# Patient Record
Sex: Male | Born: 1990 | Hispanic: No | Marital: Single | Smoking: Current every day smoker
Health system: Southern US, Community
[De-identification: ages and names within clinical notes are randomized; demographics above are authoritative.]

## PROBLEM LIST (undated history)

## (undated) DIAGNOSIS — F32A Depression, unspecified: Secondary | ICD-10-CM

## (undated) DIAGNOSIS — F329 Major depressive disorder, single episode, unspecified: Secondary | ICD-10-CM

## (undated) HISTORY — PX: NO PAST SURGERIES: SHX2092

---

## 2015-02-04 ENCOUNTER — Inpatient Hospital Stay (HOSPITAL_COMMUNITY): Payer: Self-pay

## 2015-02-04 ENCOUNTER — Inpatient Hospital Stay (HOSPITAL_COMMUNITY)
Admission: EM | Admit: 2015-02-04 | Discharge: 2015-02-05 | DRG: 918 | Disposition: A | Discharge status: Other Acute Inpatient Hospital | Payer: Self-pay | Attending: Internal Medicine | Admitting: Internal Medicine

## 2015-02-04 ENCOUNTER — Encounter (HOSPITAL_COMMUNITY): Payer: Self-pay | Admitting: Emergency Medicine

## 2015-02-04 DIAGNOSIS — F4321 Adjustment disorder with depressed mood: Secondary | ICD-10-CM | POA: Diagnosis present

## 2015-02-04 DIAGNOSIS — F17210 Nicotine dependence, cigarettes, uncomplicated: Secondary | ICD-10-CM | POA: Diagnosis present

## 2015-02-04 DIAGNOSIS — T43203S Poisoning by unspecified antidepressants, assault, sequela: Secondary | ICD-10-CM

## 2015-02-04 DIAGNOSIS — F17200 Nicotine dependence, unspecified, uncomplicated: Secondary | ICD-10-CM | POA: Diagnosis present

## 2015-02-04 DIAGNOSIS — D72829 Elevated white blood cell count, unspecified: Secondary | ICD-10-CM | POA: Diagnosis present

## 2015-02-04 DIAGNOSIS — R112 Nausea with vomiting, unspecified: Secondary | ICD-10-CM | POA: Diagnosis present

## 2015-02-04 DIAGNOSIS — Z720 Tobacco use: Secondary | ICD-10-CM

## 2015-02-04 DIAGNOSIS — T43592A Poisoning by other antipsychotics and neuroleptics, intentional self-harm, initial encounter: Principal | ICD-10-CM | POA: Diagnosis present

## 2015-02-04 DIAGNOSIS — F909 Attention-deficit hyperactivity disorder, unspecified type: Secondary | ICD-10-CM | POA: Diagnosis present

## 2015-02-04 DIAGNOSIS — T1491 Suicide attempt: Secondary | ICD-10-CM

## 2015-02-04 DIAGNOSIS — R45851 Suicidal ideations: Secondary | ICD-10-CM

## 2015-02-04 DIAGNOSIS — T50902D Poisoning by unspecified drugs, medicaments and biological substances, intentional self-harm, subsequent encounter: Secondary | ICD-10-CM

## 2015-02-04 DIAGNOSIS — G4700 Insomnia, unspecified: Secondary | ICD-10-CM | POA: Diagnosis present

## 2015-02-04 DIAGNOSIS — R109 Unspecified abdominal pain: Secondary | ICD-10-CM | POA: Diagnosis present

## 2015-02-04 DIAGNOSIS — R1033 Periumbilical pain: Secondary | ICD-10-CM

## 2015-02-04 DIAGNOSIS — T43202A Poisoning by unspecified antidepressants, intentional self-harm, initial encounter: Secondary | ICD-10-CM

## 2015-02-04 DIAGNOSIS — R1110 Vomiting, unspecified: Secondary | ICD-10-CM

## 2015-02-04 DIAGNOSIS — T43222A Poisoning by selective serotonin reuptake inhibitors, intentional self-harm, initial encounter: Secondary | ICD-10-CM | POA: Diagnosis present

## 2015-02-04 DIAGNOSIS — T50902A Poisoning by unspecified drugs, medicaments and biological substances, intentional self-harm, initial encounter: Secondary | ICD-10-CM

## 2015-02-04 DIAGNOSIS — F322 Major depressive disorder, single episode, severe without psychotic features: Secondary | ICD-10-CM

## 2015-02-04 DIAGNOSIS — Z818 Family history of other mental and behavioral disorders: Secondary | ICD-10-CM

## 2015-02-04 HISTORY — DX: Major depressive disorder, single episode, unspecified: F32.9

## 2015-02-04 HISTORY — DX: Depression, unspecified: F32.A

## 2015-02-04 LAB — COMPREHENSIVE METABOLIC PANEL
ALT: 13 U/L — ABNORMAL LOW (ref 17–63)
AST: 22 U/L (ref 15–41)
Albumin: 4.2 g/dL (ref 3.5–5.0)
Alkaline Phosphatase: 57 U/L (ref 38–126)
Anion gap: 7 (ref 5–15)
BILIRUBIN TOTAL: 0.4 mg/dL (ref 0.3–1.2)
BUN: 11 mg/dL (ref 6–20)
CO2: 24 mmol/L (ref 22–32)
CREATININE: 0.87 mg/dL (ref 0.61–1.24)
Calcium: 9.2 mg/dL (ref 8.9–10.3)
Chloride: 107 mmol/L (ref 101–111)
GFR calc Af Amer: >60 mL/min (ref >60)
Glucose, Bld: 117 mg/dL — ABNORMAL HIGH (ref 65–99)
POTASSIUM: 4.2 mmol/L (ref 3.5–5.1)
Sodium: 138 mmol/L (ref 135–145)
TOTAL PROTEIN: 6.7 g/dL (ref 6.5–8.1)

## 2015-02-04 LAB — RAPID URINE DRUG SCREEN, HOSP PERFORMED
Amphetamines: NOT DETECTED
BARBITURATES: NOT DETECTED
Benzodiazepines: NOT DETECTED
COCAINE: NOT DETECTED
OPIATES: NOT DETECTED
Tetrahydrocannabinol: NOT DETECTED

## 2015-02-04 LAB — CBC
HCT: 39.6 % (ref 39.0–52.0)
Hemoglobin: 13.8 g/dL (ref 13.0–17.0)
MCH: 29.7 pg (ref 26.0–34.0)
MCHC: 34.8 g/dL (ref 30.0–36.0)
MCV: 85.3 fL (ref 78.0–100.0)
PLATELETS: 319 10*3/uL (ref 150–400)
RBC: 4.64 MIL/uL (ref 4.22–5.81)
RDW: 12.8 % (ref 11.5–15.5)
WBC: 11.6 10*3/uL — AB (ref 4.0–10.5)

## 2015-02-04 LAB — DIFFERENTIAL
BASOS ABS: 0.1 10*3/uL (ref 0.0–0.1)
Basophils Relative: 1 %
EOS ABS: 0.3 10*3/uL (ref 0.0–0.7)
Eosinophils Relative: 2 %
LYMPHS ABS: 2.8 10*3/uL (ref 0.7–4.0)
Lymphocytes Relative: 24 %
Monocytes Absolute: 0.7 10*3/uL (ref 0.1–1.0)
Monocytes Relative: 6 %
NEUTROS ABS: 7.8 10*3/uL — AB (ref 1.7–7.7)
NEUTROS PCT: 67 %

## 2015-02-04 LAB — ETHANOL

## 2015-02-04 LAB — CBG MONITORING, ED: Glucose-Capillary: 121 mg/dL — ABNORMAL HIGH (ref 65–99)

## 2015-02-04 LAB — ACETAMINOPHEN LEVEL: Acetaminophen (Tylenol), Serum: <10 ug/mL — ABNORMAL LOW (ref 10–30)

## 2015-02-04 LAB — SALICYLATE LEVEL: Salicylate Lvl: <4 mg/dL (ref 2.8–30.0)

## 2015-02-04 LAB — MAGNESIUM: Magnesium: 1.8 mg/dL (ref 1.7–2.4)

## 2015-02-04 MED ORDER — PROMETHAZINE HCL 25 MG/ML IJ SOLN
12.5000 mg | Freq: Once | INTRAMUSCULAR | Status: AC
  Administered 2015-02-04: 12.5 mg via INTRAVENOUS
  Filled 2015-02-04: qty 1

## 2015-02-04 MED ORDER — ENOXAPARIN SODIUM 40 MG/0.4ML ~~LOC~~ SOLN
40.0000 mg | Freq: Every day | SUBCUTANEOUS | Status: DC
  Administered 2015-02-04 – 2015-02-05 (×2): 40 mg via SUBCUTANEOUS
  Filled 2015-02-04 (×3): qty 0.4

## 2015-02-04 MED ORDER — SODIUM CHLORIDE 0.9 % IJ SOLN
3.0000 mL | Freq: Two times a day (BID) | INTRAMUSCULAR | Status: DC
  Administered 2015-02-04 (×2): 3 mL via INTRAVENOUS

## 2015-02-04 MED ORDER — METOCLOPRAMIDE HCL 5 MG/ML IJ SOLN
5.0000 mg | Freq: Three times a day (TID) | INTRAMUSCULAR | Status: DC
  Administered 2015-02-04 – 2015-02-05 (×3): 5 mg via INTRAVENOUS
  Filled 2015-02-04 (×3): qty 2

## 2015-02-04 MED ORDER — SODIUM CHLORIDE 0.9 % IV SOLN
INTRAVENOUS | Status: DC
  Administered 2015-02-04 – 2015-02-05 (×2): via INTRAVENOUS
  Filled 2015-02-04 (×5): qty 1000

## 2015-02-04 MED ORDER — ONDANSETRON HCL 4 MG/2ML IJ SOLN
4.0000 mg | Freq: Four times a day (QID) | INTRAMUSCULAR | Status: DC | PRN
  Administered 2015-02-04: 4 mg via INTRAVENOUS
  Filled 2015-02-04: qty 2

## 2015-02-04 MED ORDER — MAGNESIUM SULFATE 2 GM/50ML IV SOLN
2.0000 g | Freq: Once | INTRAVENOUS | Status: AC
  Administered 2015-02-04: 2 g via INTRAVENOUS
  Filled 2015-02-04: qty 50

## 2015-02-04 MED ORDER — LORAZEPAM 2 MG/ML IJ SOLN: 1.0000 mg | INTRAMUSCULAR | Status: DC | PRN

## 2015-02-04 NOTE — ED Notes (Signed)
Pt escorted to restroom with this RN and Zella Ballobin NT

## 2015-02-04 NOTE — ED Notes (Signed)
Just spoke with poison control. They called to follow up on pt and see how he is doing. Vital signs and treatment thursfar shared with poison control. Pt is stable and VSS.

## 2015-02-04 NOTE — Consult Note (Signed)
[facility] Face-to-Face Psychiatry Consult   Reason for Consult:  Depression, suicide attempt by overdose Referring Physician: Dr. Shanon Brow Tat Patient Identification: Spencer Hale MRN:  [id number] Principal Diagnosis: Intentional drug overdose [facility]) Diagnosis:   Patient Active Problem List   Diagnosis Date Noted  . Intentional drug overdose ([place]) [T50.902A] 02/04/2015    Priority: High  . Major depressive disorder, single episode, severe without psychotic features ([place]) [F32.2]     Priority: High  . Overdose of antidepressant [T43.201A] 02/04/2015  . Tobacco abuse [Z72.0] 02/04/2015  . Abdominal pain [R10.9] 02/04/2015  . Suicide attempt ([place]) [T14.91] 02/04/2015    Total Time spent with patient: 1 hour  Subjective:   Spencer Hale is a 24 y.o. male patient admitted with suicide attempt by overdosing on multiple medications.  HPI: Thanks for asking me to do a psychiatric consultation on Spencer Hale,  a 24 y.o. Man, single, unemployed who reports history of ADHD and Depression. Patient was brought to the Emergency Department after he attempted suicide by overdosing on his girlfriend's medications. Patient reports that he was having a heated argument with his girlfriend last night, things got out of hand and he overdosed on 30 pills of Abilify, Celexa, and Prozac. Patient reports worsening depressive symptoms for the past 2 months after losing his job and has not been able to find another one. He is stressed out due to lack of money to pay for child support and recurrent arguments with his current girlfriend. Patient  reports frequent crying episodes, hopelessness, poor appetite, difficulty sleeping and recurrent suicidal thoughts. He  denies delusional thinking, auditory/visual hallucinations, drugs or alcohol abuse. He needs psychiatric inpatient admission, he is unable to contract for safety.   Past Psychiatric History: ADHD  Risk to Self: Is patient at risk for suicide?: Yes Risk to Others:    Prior Inpatient Therapy:   Prior Outpatient Therapy:    Past Medical History:  Past Medical History  Diagnosis Date  . Depression     Past Surgical History  Procedure Laterality Date  . No past surgeries     Family History: History reviewed. No pertinent family history. Family Psychiatric  History: Mother and maternal grandmother suffers from depression Social History:  History  Alcohol Use  . Yes    Comment: [employer] hard lemonade on weekends     History  Drug Use No    Social History   Social History  . Marital Status: Single    Spouse Name: N/A  . Number of Children: N/A  . Years of Education: N/A   Social History Main Topics  . Smoking status: Current Every Day Smoker    Types: Cigarettes  . Smokeless tobacco: None  . Alcohol Use: Yes     Comment: [employer] hard lemonade on weekends  . Drug Use: No  . Sexual Activity: Not Asked   Other Topics Concern  . None   Social History Narrative  . None   Additional Social History:                          Allergies:  No Known Allergies  Labs:  Results for orders placed or performed during the hospital encounter of 02/04/15 (from the past 48 hour(s))  Comprehensive metabolic panel     Status: Abnormal   Collection Time: 02/04/15  2:40 AM  Result Value Ref Range   Sodium 138 135 - 145 mmol/L   Potassium 4.2 3.5 - 5.1 mmol/L   Chloride  107 101 - 111 mmol/L   CO2 24 22 - 32 mmol/L   Glucose, Bld 117 (H) 65 - 99 mg/dL   BUN 11 6 - 20 mg/dL   Creatinine, Ser 0.87 0.61 - 1.24 mg/dL   Calcium 9.2 8.9 - 10.3 mg/dL   Total Protein 6.7 6.5 - 8.1 g/dL   Albumin 4.2 3.5 - 5.0 g/dL   AST 22 15 - 41 U/L   ALT 13 (L) 17 - 63 U/L   Alkaline Phosphatase 57 38 - 126 U/L   Total Bilirubin 0.4 0.3 - 1.2 mg/dL   GFR calc non Af Amer >60 >60 mL/min   GFR calc Af Amer >60 >60 mL/min    Comment: (NOTE) The eGFR has been calculated using the CKD EPI equation. This calculation has not been validated in all clinical  situations. eGFR's persistently <60 mL/min signify possible Chronic Kidney Disease.    Anion gap 7 5 - 15  Ethanol (ETOH)     Status: None   Collection Time: 02/04/15  2:40 AM  Result Value Ref Range   Alcohol, Ethyl (B) <5 <5 mg/dL    Comment:        LOWEST DETECTABLE LIMIT FOR SERUM ALCOHOL IS 5 mg/dL FOR MEDICAL PURPOSES ONLY   Salicylate level     Status: None   Collection Time: 02/04/15  2:40 AM  Result Value Ref Range   Salicylate Lvl <4.1 2.8 - 30.0 mg/dL  Acetaminophen level     Status: Abnormal   Collection Time: 02/04/15  2:40 AM  Result Value Ref Range   Acetaminophen (Tylenol), Serum <10 (L) 10 - 30 ug/mL    Comment:        THERAPEUTIC CONCENTRATIONS VARY SIGNIFICANTLY. A RANGE OF 10-30 ug/mL MAY BE AN EFFECTIVE CONCENTRATION FOR MANY PATIENTS. HOWEVER, SOME ARE BEST TREATED AT CONCENTRATIONS OUTSIDE THIS RANGE. ACETAMINOPHEN CONCENTRATIONS >150 ug/mL AT 4 HOURS AFTER INGESTION AND >50 ug/mL AT 12 HOURS AFTER INGESTION ARE OFTEN ASSOCIATED WITH TOXIC REACTIONS.   CBC     Status: Abnormal   Collection Time: 02/04/15  2:40 AM  Result Value Ref Range   WBC 11.6 (H) 4.0 - 10.5 K/uL   RBC 4.64 4.22 - 5.81 MIL/uL   Hemoglobin 13.8 13.0 - 17.0 g/dL   HCT 39.6 39.0 - 52.0 %   MCV 85.3 78.0 - 100.0 fL   MCH 29.7 26.0 - 34.0 pg   MCHC 34.8 30.0 - 36.0 g/dL   RDW 12.8 11.5 - 15.5 %   Platelets 319 150 - 400 K/uL  Magnesium     Status: None   Collection Time: 02/04/15  2:40 AM  Result Value Ref Range   Magnesium 1.8 1.7 - 2.4 mg/dL  Differential     Status: Abnormal   Collection Time: 02/04/15  2:40 AM  Result Value Ref Range   Neutrophils Relative % 67 %   Neutro Abs 7.8 (H) 1.7 - 7.7 K/uL   Lymphocytes Relative 24 %   Lymphs Abs 2.8 0.7 - 4.0 K/uL   Monocytes Relative 6 %   Monocytes Absolute 0.7 0.1 - 1.0 K/uL   Eosinophils Relative 2 %   Eosinophils Absolute 0.3 0.0 - 0.7 K/uL   Basophils Relative 1 %   Basophils Absolute 0.1 0.0 - 0.1 K/uL  Urine  rapid drug screen (hosp performed) (Not at [facility])     Status: None   Collection Time: 02/04/15  2:59 AM  Result Value Ref Range   Opiates NONE DETECTED  NONE DETECTED   Cocaine NONE DETECTED NONE DETECTED   Benzodiazepines NONE DETECTED NONE DETECTED   Amphetamines NONE DETECTED NONE DETECTED   Tetrahydrocannabinol NONE DETECTED NONE DETECTED   Barbiturates NONE DETECTED NONE DETECTED    Comment:        DRUG SCREEN FOR MEDICAL PURPOSES ONLY.  IF CONFIRMATION IS NEEDED FOR ANY PURPOSE, NOTIFY LAB WITHIN 5 DAYS.        LOWEST DETECTABLE LIMITS FOR URINE DRUG SCREEN Drug Class       Cutoff (ng/mL) Amphetamine      1000 Barbiturate      200 Benzodiazepine   569 Tricyclics       794 Opiates          300 Cocaine          300 THC              50   CBG monitoring, ED     Status: Abnormal   Collection Time: 02/04/15  3:05 AM  Result Value Ref Range   Glucose-Capillary 121 (H) 65 - 99 mg/dL    Current Facility-Administered Medications  Medication Dose Route Frequency Provider Last Rate Last Dose  . enoxaparin (LOVENOX) injection 40 mg  40 mg Subcutaneous Daily Norval Morton, MD   40 mg at 02/04/15 1010  . LORazepam (ATIVAN) injection 1 mg  1 mg Intravenous Q4H PRN Norval Morton, MD      . ondansetron (ZOFRAN) injection 4 mg  4 mg Intravenous Q6H PRN Orson Eva, MD   4 mg at 02/04/15 1336  . sodium chloride 0.9 % 1,000 mL with potassium chloride 20 mEq infusion   Intravenous Continuous David Tat, MD      . sodium chloride 0.9 % injection 3 mL  3 mL Intravenous Q12H Rondell Charmayne Sheer, MD   3 mL at 02/04/15 1011    Musculoskeletal: Strength & Muscle Tone: within normal limits Gait & Station: normal Patient leans: N/A  Psychiatric Specialty Exam: Review of Systems  Constitutional: Positive for malaise/fatigue and diaphoresis.  HENT: Negative.   Eyes: Negative.   Respiratory: Negative.   Cardiovascular: Negative.   Gastrointestinal: Positive for nausea, vomiting and abdominal  pain.  Genitourinary: Negative.   Musculoskeletal: Negative.   Skin: Negative.   Neurological: Positive for weakness.  Endo/Heme/Allergies: Negative.   Psychiatric/Behavioral: Positive for depression and suicidal ideas. The patient is nervous/anxious.     Blood pressure 147/84, pulse 88, temperature 98.2 F (36.8 C), temperature source Oral, resp. rate 16, height 5' 4.8" (1.646 m), weight 56.7 kg (125 lb), SpO2 100 %.Body mass index is 20.93 kg/(m^2).  General Appearance: Casual  Eye Contact::  Minimal  Speech:  Clear and Coherent  Volume:  Decreased  Mood:  Depressed, Dysphoric and Hopeless  Affect:  Constricted  Thought Process:  Goal Directed  Orientation:  Full (Time, Place, and Person)  Thought Content:  Negative  Suicidal Thoughts:  Yes.  without intent/plan  Homicidal Thoughts:  No  Memory:  Immediate;   Good Recent;   Good Remote;   Good  Judgement:  Impaired  Insight:  Lacking  Psychomotor Activity:  Decreased  Concentration:  Fair  Recall:  Good  Fund of Knowledge:Good  Language: Good  Akathisia:  No  Handed:  Right  AIMS (if indicated):     Assets:  Communication Skills Desire for Improvement Physical Health Social Support  ADL's:  Intact  Cognition: WNL  Sleep:   poor   Treatment Plan Summary: Daily contact  with patient to assess and evaluate symptoms and progress in treatment: Medication management; Please consider putting patient on antidepressant when medically cleared.  Disposition:  -Recommend psychiatric Inpatient admission when medically cleared. -Supportive therapy provided about ongoing stressors. -Unit social worker to assist with placing patient in inpatient psychiatric facility when he is medically stable.  Corena Pilgrim, MD 02/04/2015 3:09 PM

## 2015-02-04 NOTE — ED Notes (Signed)
Pt escorted back to bed with this RN and Veterinary surgeonobin RN. Pt had formed bowel movement.

## 2015-02-04 NOTE — ED Notes (Addendum)
Notified [facility] of patient's ingestion, she reports that prozac will cause sinus tachycardia, HTN and QTC elongation. Abilify will cause CNS depression and tachycardia. Celexa will cause ventricular dysrhythmias for up to 24 hours, bradycardia,QTC elongation and seizures. Almira CoasterGina recommends 24 hour cardiac monitoring and repeat EKG at that time. Recommends drawing k&mag levels, and replace to optimal level, benzos as needed for symptom control.

## 2015-02-04 NOTE — ED Notes (Signed)
Notified Dr. Preston FleetingGlick of patient's presence in department and Poison Control's recommendation.

## 2015-02-04 NOTE — H&P (Signed)
[facility] History and Physical  Spencer Hale JYN:[id number] DOB: Aug 07, 1990 DOA: 02/04/2015  Referring physician: ED PCP: No PCP Per Patient   Chief Complaint: Overdose HPI:  Patient is a 24 year old male with past medical history significant for tobacco abuse; who presents after an intentional overdose with a combination of pills including Abilify, Prozac, and Celexa around 10:30 PM last night. Patient states that he wanted to be happy as the reason why he took the pills. He notes that the pills were not prescribed to him. Does not answer how he obtained them Additional history is obtained from ED physician, who states that the patient just recently lost his job 2 months ago and has been feeling depressed.   Currently denies suicidal ideation, fever, chills. Patient does note associated symptoms of some abdominal pain located periumbilically. He does not necessarily offer any other details regarding when symptoms started   Once in the emergency department poison control was notified and stated that the patient needed 24-hour monitoring for possible prolonged QT, agitation, and electrolyte abnormalities.  Review of Systems  Constitutional: Negative for fever and chills.  HENT: Negative for hearing loss.   Eyes: Negative for double vision and discharge.  Respiratory: Negative for cough and hemoptysis.   Cardiovascular: Negative for chest pain and palpitations.  Gastrointestinal: Positive for abdominal pain. Negative for nausea and vomiting.  Genitourinary: Negative for dysuria and urgency.  Musculoskeletal: Negative for back pain and neck pain.  Skin: Negative for itching and rash.  Neurological: Negative for sensory change, speech change and headaches.  Endo/Heme/Allergies: Negative for environmental allergies. Does not bruise/bleed easily.  Psychiatric/Behavioral: Positive for depression and suicidal ideas. The patient has insomnia. The patient is not nervous/anxious.          Past Medical History  Diagnosis Date  . Depression      Past Surgical History  Procedure Laterality Date  . No past surgeries        Social History:  reports that he has been smoking Cigarettes.  He does not have any smokeless tobacco history on file. He reports that he drinks alcohol. He reports that he does not use illicit drugs.   Where does patient live--home   Can patient participate in ADLs?YES  No Known Allergies  History reviewed. No pertinent family history.    FAMILY HISTORY  When questioned  Directly-patient reports  No family history of HTN, CVA ,DIABETES, TB, Cancer CAD, Bleeding Disorders, Sickle Cell, diabetes, anemia, asthma,   Prior to Admission medications   Not on File     Physical Exam: Filed Vitals:   02/04/15 0400 02/04/15 0415 02/04/15 0430 02/04/15 0445  BP: 122/74 130/72 132/70 127/72  Pulse: 92 85 75 74  Temp:      TempSrc:      Resp: SpO2: 99% 99% 98% 97%     Constitutional: Vital signs reviewed. Patient is a well-developed and well-nourished in no acute distress and cooperative with exam. Lethargic, but arousable patient drifts off to sleep several times during exam. Head: Normocephalic and atraumatic  Ear: TM normal bilaterally  Mouth: no erythema or exudates, MMM  Eyes: PERRL, EOMI, conjunctivae normal, No scleral icterus.  Neck: Supple, Trachea midline normal ROM, No JVD, mass, thyromegaly, or carotid bruit present.  Cardiovascular: RRR, S1 normal, S2 normal, no MRG, pulses symmetric and intact bilaterally  Pulmonary/Chest: CTAB, no wheezes, rales, or rhonchi  Abdominal: Soft. Tenderness to palpation around umbilical region no hernia noted, non-distended, bowel sounds are  normal, no masses, organomegaly, or guarding present.  GU: no CVA tenderness Musculoskeletal: No joint deformities, erythema, or stiffness, ROM full and no nontender Ext: no edema and no cyanosis, pulses palpable bilaterally (DP and PT)   Hematology: no cervical, inginal, or axillary adenopathy.  Neurological: A&O x3, Strenght is normal and symmetric bilaterally, cranial nerve II-XII are grossly intact, no focal motor deficit, sensory intact to light touch bilaterally.  Skin: Warm, dry and intact. No rash, cyanosis, or clubbing.  Psychiatric: flat affect. Speech is normal. Judgment is poor. Cognition and memory are normal.      Data Review   Micro Results No results found for this or any previous visit (from the past 240 hour(s)).  Radiology Reports No results found.   CBC  Recent Labs Lab 02/04/15 0240  WBC 11.6*  HGB 13.8  HCT 39.6  PLT 319  MCV 85.3  MCH 29.7  MCHC 34.8  RDW 12.8  LYMPHSABS 2.8  MONOABS 0.7  EOSABS 0.3  BASOSABS 0.1    Chemistries   Recent Labs Lab 02/04/15 0240  NA 138  K 4.2  CL 107  CO2 24  GLUCOSE 117*  BUN 11  CREATININE 0.87  CALCIUM 9.2  MG 1.8  AST 22  ALT 13*  ALKPHOS 57  BILITOT 0.4   ------------------------------------------------------------------------------------------------------------------ CrCl cannot be calculated (Unknown ideal weight.). ------------------------------------------------------------------------------------------------------------------ No results for input(s): HGBA1C in the last 72 hours. ------------------------------------------------------------------------------------------------------------------ No results for input(s): CHOL, HDL, LDLCALC, TRIG, CHOLHDL, LDLDIRECT in the last 72 hours. ------------------------------------------------------------------------------------------------------------------ No results for input(s): TSH, T4TOTAL, T3FREE, THYROIDAB in the last 72 hours.  Invalid input(s): FREET3 ------------------------------------------------------------------------------------------------------------------ No results for input(s): VITAMINB12, FOLATE, FERRITIN, TIBC, IRON, RETICCTPCT in the last 72  hours.  Coagulation profile No results for input(s): INR, PROTIME in the last 168 hours.  No results for input(s): DDIMER in the last 72 hours.  Cardiac Enzymes No results for input(s): CKMB, TROPONINI, MYOGLOBIN in the last 168 hours.  Invalid input(s): CK ------------------------------------------------------------------------------------------------------------------ Invalid input(s): POCBNP   CBG:  Recent Labs Lab 02/04/15 0305  GLUCAP 121*       EKG: Independently reviewed. Normal sinus rhythm is a normal EKG   Assessment/Plan Principal Problem:  Intentional drug overdose with antidepressants: Patient with a overdose of approximately 20-30 pills including Abilify, Prozac, and Celexa at approximately 10:30 PM. Patient was attempting suicide. Poison control contacted recommend cardiac monitoring for 24 hours. -Admit to a telemetry bed -sitter to bedside -EKGs every 6 hours x3 -Ativan prn agitation -Monitoring for electrolyte abnormalities with BMP  Suicide attempt: Patient currently denies any suicidal ideations. -Once medically stable will need Psych consult    Tobacco abuse: Patient smokes one fourth pack a day of cigarettes. -Offered nicotine patch  Abdominal pain: Unclear  Cause for symptoms at this time. PE no overt sign of abdominal hernia +bowel signs. -continue to monitor  Code Status:   full Family Communication: bedside Disposition Plan: admit   Total time spent 55 minutes.Greater than 50% of this time was spent in counseling, explanation of diagnosis, planning of further management, and coordination of care  Clydie BraunRondell A Smith [facility] Pager [phone]  If 7PM-7AM, please contact night-coverage [email or web address] Password [email or web address] 02/04/2015, 5:00 AM

## 2015-02-04 NOTE — Progress Notes (Signed)
Received order from Dr. Arbutus Leasat requesting psychiatric consult for patient.   TTS counselor to [facility] for completion.   Janann ColonelGregory Pickett Jr. MSW, LCSW Therapeutic Triage Services-Triage Specialist   Phone: [phone]

## 2015-02-04 NOTE — ED Notes (Addendum)
Pt arrives via EMS for od of celexa, prozac, and abilify - 30 pills in total. One episode of emesis PTA with some pills present in vomit. States he ODed with GF, who [facility]. Pt bradying down, then sinus tach. Pt reports that he's had plans to attempt suicide for 3-4 days, states he's sad. Would not elaborate further for this RN. Pt states he made a mistake, and that he feels stupid. Pt is alertx4, remorseful, skin warm and dry. Ambulates with a steady gait. Pt has been made aware that because he attempted suicide tonight, he looses a lot of privileges in order to maintain safety, including access to his cell phone.

## 2015-02-04 NOTE — Progress Notes (Signed)
PROGRESS NOTE  Spencer Hale ZOX:[id number] DOB: [birth year] DOA: 02/04/2015 PCP: No PCP Per Patient  Brief history 24 year old male with a history of depression and no other chronic medical problems presented with intentional drug overdose as part of the suicide attempt. The patient feels depressed because he has lost his job approximately 2 months ago and has not been able to find new work. He states that he took his girlfriends pills, approximately 30 pills total around 10:30 PM on 02/03/2015. Patient was not forthcoming regarding exactly what pills he took, but from the medical record, it appears that he took Abilify, fluoxetine, and Celexa. He denies any headache, chest pain or shortness breath, diarrhea, fevers, chills. He had an episode of nausea and vomiting since admission but denies any abdominal pain, dysuria, hematuria. He smokes approximately 1 pack every 3 days. He denies any alcohol or other illegal drug use. Assessment/Plan: Intentional Overdose/Suicide attempt -psychiatry has been consulted -one-on-one sitter -monitor for QT prolongation -Urine drug screen is negative -Repeat EKG in the morning -Repeat BMP in am  Tobacco Abuse -Tobacco cessation discussed -NicoDerm patch deferred  Nausea/vomiting -Hepatic enzymes negative -check lipase -IVF  Abdominal pain -resolved -improved after BM  Leukocytosis -Afebrile and hemodynamically stable -Recommend off antibiotics -UA  Family Communication:   Pt at beside Disposition Plan:   Home 11/14 if stable and cleared by psychiatry       Procedures/Studies:  No results found.      Subjective: Patient states that abdominal pain is improved. Her one episode of nausea and vomiting today. Denies any fevers, chills, chest pain, shortness breath, diarrhea, hematochezia, melena. There is no dysuria or hematuria. No headache or visual disturbance.  Objective: Filed Vitals:   02/04/15 0515 02/04/15 0530  02/04/15 0545 02/04/15 0606  BP: 146/79 144/84 144/77 147/84  Pulse: 90 91 99 88  Temp:    98.2 F (36.8 C)  TempSrc:    Oral  Resp: 18 14 19 16   Height:    5' 4.8" (1.646 m)  Weight:    56.7 kg (125 lb)  SpO2: 97% 97% 98% 100%   No intake or output data in the 24 hours ending 02/04/15 1351 Weight change:  Exam:   General:  Pt is alert, follows commands appropriately, not in acute distress  HEENT: No icterus, No thrush, No neck mass, [place]/AT  Cardiovascular: RRR, S1/S2, no rubs, no gallops  Respiratory: CTA bilaterally, no wheezing, no crackles, no rhonchi  Abdomen: Soft/+BS, non tender, non distended, no guarding  Extremities: No edema, No lymphangitis, No petechiae, No rashes, no synovitis  Data Reviewed: Basic Metabolic Panel:  Recent Labs Lab 02/04/15 0240  NA 138  K 4.2  CL 107  CO2 24  GLUCOSE 117*  BUN 11  CREATININE 0.87  CALCIUM 9.2  MG 1.8   Liver Function Tests:  Recent Labs Lab 02/04/15 0240  AST 22  ALT 13*  ALKPHOS 57  BILITOT 0.4  PROT 6.7  ALBUMIN 4.2   No results for input(s): LIPASE, AMYLASE in the last 168 hours. No results for input(s): AMMONIA in the last 168 hours. CBC:  Recent Labs Lab 02/04/15 0240  WBC 11.6*  NEUTROABS 7.8*  HGB 13.8  HCT 39.6  MCV 85.3  PLT 319   Cardiac Enzymes: No results for input(s): CKTOTAL, CKMB, CKMBINDEX, TROPONINI in the last 168 hours. BNP: Invalid input(s): POCBNP CBG:  Recent Labs Lab 02/04/15 0305  GLUCAP 121*  No results found for this or any previous visit (from the past 240 hour(s)).   Scheduled Meds: . enoxaparin (LOVENOX) injection  40 mg Subcutaneous Daily  . sodium chloride  3 mL Intravenous Q12H   Continuous Infusions:    Marletta Bousquet, DO  [facility] Pager [phone]  If 7PM-7AM, please contact night-coverage [email or web address] Password [email or web address] 02/04/2015, 1:51 PM   LOS: 0 days

## 2015-02-04 NOTE — ED Notes (Signed)
5W Charge RN called and stated that they could not take the pt for 30-45 minutes due to not having a sitter.

## 2015-02-04 NOTE — Progress Notes (Signed)
CMDD notified pt in sinus tachycardia. NAD upon pt assessment. Pt reports it feels like his heart is racing. HR weak, fast, regular. No symptoms present. Will continue to monitor.

## 2015-02-04 NOTE — Progress Notes (Signed)
Received report from MuscodaKevin in the ED room 22.

## 2015-02-04 NOTE — ED Provider Notes (Signed)
CSN: [id number]     Arrival date & time 02/04/15  0202 History  By signing my name below, I, [facility], attest that this documentation has been prepared under the direction and in the presence of Dione Booze, MD. Electronically Signed: Randell Patient, ED Scribe. 02/04/2015. 2:33 AM.   Chief Complaint  Patient presents with  . Drug Overdose  . Suicide Attempt   The history is provided by the patient. No language interpreter was used.   HPI Comments: Spencer Hale is a 24 y.o. male who presents to the Emergency Department after a suicide attempt by drug OD about 4 hours ago. He reports taking 30 pills of Abilify, Celexa, and Prozac with his girlfriend. Patient reports feeling depressed for the past 2 months after losing his job and reports associated increased crying, insomnia, and no desire to do daily activities. Patient denies auditory or visual hallucinations, fever, or current suicidal thoughts. He is a 0.3 ppd smoker and denies illicit drug use. He denies SI or depression prior to losing his job a few months ago.    History reviewed. No pertinent past medical history. History reviewed. No pertinent past surgical history. No family history on file. Social History  Substance Use Topics  . Smoking status: Current Every Day Smoker    Types: Cigarettes  . Smokeless tobacco: None  . Alcohol Use: No    Review of Systems  Psychiatric/Behavioral: Positive for sleep disturbance and dysphoric mood. Negative for suicidal ideas and hallucinations.  All other systems reviewed and are negative.    Allergies  Review of patient's allergies indicates no known allergies.  Home Medications   Prior to Admission medications   Not on File   BP 138/88 mmHg  Pulse 84  Temp(Src) 98 F (36.7 C) (Oral)  Resp 20  SpO2 99% Physical Exam  Constitutional: He is oriented to person, place, and time. He appears well-developed and well-nourished. No distress.  HENT:  Head:  Normocephalic and atraumatic.  Right Ear: Hearing normal.  Left Ear: Hearing normal.  Nose: Nose normal.  Mouth/Throat: Oropharynx is clear and moist and mucous membranes are normal.  Eyes: Conjunctivae and EOM are normal. Pupils are equal, round, and reactive to light.  Neck: Normal range of motion. Neck supple. No JVD present.  Cardiovascular: Normal rate, regular rhythm, S1 normal, S2 normal and normal heart sounds.  Exam reveals no gallop and no friction rub.   No murmur heard. Pulmonary/Chest: Effort normal and breath sounds normal. He has no wheezes. He has no rales. He exhibits no tenderness.  Abdominal: Soft. Normal appearance and bowel sounds are normal. He exhibits no distension and no mass. There is no hepatosplenomegaly. There is no tenderness. There is no tenderness at McBurney's point and negative Murphy's sign. No hernia.  Musculoskeletal: Normal range of motion.  Lymphadenopathy:    He has no cervical adenopathy.  Neurological: He is alert and oriented to person, place, and time. He has normal strength. No cranial nerve deficit or sensory deficit. He exhibits normal muscle tone. Coordination normal. GCS eye subscore is 4. GCS verbal subscore is 5. GCS motor subscore is 6.  Skin: Skin is warm, dry and intact. No rash noted. No cyanosis.  Psychiatric: His speech is normal and behavior is normal. Thought content normal. He exhibits a depressed mood.  Depressed affect  Nursing note and vitals reviewed.   ED Course  Procedures   DIAGNOSTIC STUDIES: Oxygen Saturation is 99% on RA, normal by my interpretation.    COORDINATION  OF CARE: 2:06 AM Discussed treatment plan with pt at bedside and pt agreed to plan.   Labs Review Results for orders placed or performed during the hospital encounter of 02/04/15  Comprehensive metabolic panel  Result Value Ref Range   Sodium 138 135 - 145 mmol/L   Potassium 4.2 3.5 - 5.1 mmol/L   Chloride 107 101 - 111 mmol/L   CO2 24 22 - 32  mmol/L   Glucose, Bld 117 (H) 65 - 99 mg/dL   BUN 11 6 - 20 mg/dL   Creatinine, Ser 1.61 0.61 - 1.24 mg/dL   Calcium 9.2 8.9 - 09.6 mg/dL   Total Protein 6.7 6.5 - 8.1 g/dL   Albumin 4.2 3.5 - 5.0 g/dL   AST 22 15 - 41 U/L   ALT 13 (L) 17 - 63 U/L   Alkaline Phosphatase 57 38 - 126 U/L   Total Bilirubin 0.4 0.3 - 1.2 mg/dL   GFR calc non Af Amer >60 >60 mL/min   GFR calc Af Amer >60 >60 mL/min   Anion gap 7 5 - 15  Ethanol (ETOH)  Result Value Ref Range   Alcohol, Ethyl (B) <5 <5 mg/dL  Salicylate level  Result Value Ref Range   Salicylate Lvl <4.0 2.8 - 30.0 mg/dL  Acetaminophen level  Result Value Ref Range   Acetaminophen (Tylenol), Serum <10 (L) 10 - 30 ug/mL  CBC  Result Value Ref Range   WBC 11.6 (H) 4.0 - 10.5 K/uL   RBC 4.64 4.22 - 5.81 MIL/uL   Hemoglobin 13.8 13.0 - 17.0 g/dL   HCT 04.5 40.9 - 81.1 %   MCV 85.3 78.0 - 100.0 fL   MCH 29.7 26.0 - 34.0 pg   MCHC 34.8 30.0 - 36.0 g/dL   RDW 91.4 78.2 - 95.6 %   Platelets 319 150 - 400 K/uL  Urine rapid drug screen (hosp performed) (Not at [facility])  Result Value Ref Range   Opiates NONE DETECTED NONE DETECTED   Cocaine NONE DETECTED NONE DETECTED   Benzodiazepines NONE DETECTED NONE DETECTED   Amphetamines NONE DETECTED NONE DETECTED   Tetrahydrocannabinol NONE DETECTED NONE DETECTED   Barbiturates NONE DETECTED NONE DETECTED  Magnesium  Result Value Ref Range   Magnesium 1.8 1.7 - 2.4 mg/dL  Differential  Result Value Ref Range   Neutrophils Relative % 67 %   Neutro Abs 7.8 (H) 1.7 - 7.7 K/uL   Lymphocytes Relative 24 %   Lymphs Abs 2.8 0.7 - 4.0 K/uL   Monocytes Relative 6 %   Monocytes Absolute 0.7 0.1 - 1.0 K/uL   Eosinophils Relative 2 %   Eosinophils Absolute 0.3 0.0 - 0.7 K/uL   Basophils Relative 1 %   Basophils Absolute 0.1 0.0 - 0.1 K/uL  CBG monitoring, ED  Result Value Ref Range   Glucose-Capillary 121 (H) 65 - 99 mg/dL    EKG Interpretation   Date/Time:  Sunday February 04 2015 02:13:08  EST Ventricular Rate:  65 PR Interval:  121 QRS Duration: 96 QT Interval:  402 QTC Calculation: 418 R Axis:   83 Text Interpretation:  Sinus rhythm Normal ECG No old tracing to compare  Confirmed by [facility]  MD, Torell Minder ([id number]) on 02/04/2015 2:22:50 AM      CRITICAL CARE Performed by: MVHQI,ONGEX Total critical care time: 35 minutes Critical care time was exclusive of separately billable procedures and treating other patients. Critical care was necessary to treat or prevent imminent or life-threatening deterioration. Critical care  was time spent personally by me on the following activities: development of treatment plan with patient and/or surrogate as well as nursing, discussions with consultants, evaluation of patient's response to treatment, examination of patient, obtaining history from patient or surrogate, ordering and performing treatments and interventions, ordering and review of laboratory studies, ordering and review of radiographic studies, pulse oximetry and re-evaluation of patient's condition. MDM   Final diagnoses:  Intentional drug overdose, initial encounter (HCC)  Suicidal ideation  Major depressive disorder, single episode, severe without psychotic features (HCC)    Major depression which apparently was triggered by job loss. Suicide attempt by drug overdose. Nursing notes appreciated. Poison control recommends 24-hour observation with monitoring of potassium and magnesium levels and checking for QTC prolongation. Initial QTc is normal and potassium is 4.0. Magnesium is slightly low at 1.8 so he is given a bolus of potassium. He will not be considered medically cleared until he has completed 24 hours of observation. Therefore, he will be admitted to the medicine service for this period of observation. Case is discussed with Dr. Katrinka BlazingSmith of [facility] agrees to [facility] the patient.  I personally performed the services described in this documentation, which was scribed in my  presence. The recorded information has been reviewed and is accurate.      Dione Boozeavid Roni Scow, MD 02/04/15 [phone]

## 2015-02-05 ENCOUNTER — Inpatient Hospital Stay
Admit: 2015-02-05 | Discharge: 2015-02-06 | DRG: 881 | Disposition: A | Discharge status: Home / Self Care | Payer: No Typology Code available for payment source | Source: Other Acute Inpatient Hospital | Attending: Psychiatry | Admitting: Psychiatry

## 2015-02-05 DIAGNOSIS — T50902A Poisoning by unspecified drugs, medicaments and biological substances, intentional self-harm, initial encounter: Secondary | ICD-10-CM | POA: Diagnosis present

## 2015-02-05 DIAGNOSIS — R45851 Suicidal ideations: Secondary | ICD-10-CM

## 2015-02-05 DIAGNOSIS — Z79899 Other long term (current) drug therapy: Secondary | ICD-10-CM | POA: Diagnosis not present

## 2015-02-05 DIAGNOSIS — Z818 Family history of other mental and behavioral disorders: Secondary | ICD-10-CM

## 2015-02-05 DIAGNOSIS — F909 Attention-deficit hyperactivity disorder, unspecified type: Secondary | ICD-10-CM | POA: Diagnosis present

## 2015-02-05 DIAGNOSIS — F17210 Nicotine dependence, cigarettes, uncomplicated: Secondary | ICD-10-CM | POA: Diagnosis present

## 2015-02-05 DIAGNOSIS — F4321 Adjustment disorder with depressed mood: Principal | ICD-10-CM | POA: Diagnosis present

## 2015-02-05 DIAGNOSIS — Z560 Unemployment, unspecified: Secondary | ICD-10-CM | POA: Diagnosis not present

## 2015-02-05 DIAGNOSIS — F17200 Nicotine dependence, unspecified, uncomplicated: Secondary | ICD-10-CM | POA: Diagnosis present

## 2015-02-05 DIAGNOSIS — F332 Major depressive disorder, recurrent severe without psychotic features: Secondary | ICD-10-CM

## 2015-02-05 LAB — URINALYSIS, ROUTINE W REFLEX MICROSCOPIC
Bilirubin Urine: NEGATIVE
GLUCOSE, UA: NEGATIVE mg/dL
Hgb urine dipstick: NEGATIVE
KETONES UR: NEGATIVE mg/dL
LEUKOCYTES UA: NEGATIVE
Nitrite: NEGATIVE
PH: 6 (ref 5.0–8.0)
Protein, ur: NEGATIVE mg/dL
Specific Gravity, Urine: 1.012 (ref 1.005–1.030)
Urobilinogen, UA: 0.2 mg/dL (ref 0.0–1.0)

## 2015-02-05 LAB — HEPATIC FUNCTION PANEL
ALBUMIN: 4 g/dL (ref 3.5–5.0)
ALK PHOS: 47 U/L (ref 38–126)
ALT: 13 U/L — AB (ref 17–63)
AST: 18 U/L (ref 15–41)
BILIRUBIN TOTAL: 0.5 mg/dL (ref 0.3–1.2)
Bilirubin, Direct: <0.1 mg/dL — ABNORMAL LOW (ref 0.1–0.5)
Total Protein: 6.7 g/dL (ref 6.5–8.1)

## 2015-02-05 LAB — CBC
HEMATOCRIT: 38.6 % — AB (ref 39.0–52.0)
HEMOGLOBIN: 13.3 g/dL (ref 13.0–17.0)
MCH: 29.8 pg (ref 26.0–34.0)
MCHC: 34.5 g/dL (ref 30.0–36.0)
MCV: 86.4 fL (ref 78.0–100.0)
Platelets: 302 10*3/uL (ref 150–400)
RBC: 4.47 MIL/uL (ref 4.22–5.81)
RDW: 13.1 % (ref 11.5–15.5)
WBC: 7.1 10*3/uL (ref 4.0–10.5)

## 2015-02-05 LAB — BASIC METABOLIC PANEL
ANION GAP: 7 (ref 5–15)
BUN: 11 mg/dL (ref 6–20)
CALCIUM: 9.4 mg/dL (ref 8.9–10.3)
CHLORIDE: 107 mmol/L (ref 101–111)
CO2: 26 mmol/L (ref 22–32)
Creatinine, Ser: 0.84 mg/dL (ref 0.61–1.24)
GFR calc Af Amer: >60 mL/min (ref >60)
GFR calc non Af Amer: >60 mL/min (ref >60)
GLUCOSE: 105 mg/dL — AB (ref 65–99)
Potassium: 4 mmol/L (ref 3.5–5.1)
Sodium: 140 mmol/L (ref 135–145)

## 2015-02-05 LAB — LIPASE, BLOOD: Lipase: 33 U/L (ref 11–51)

## 2015-02-05 LAB — MAGNESIUM: MAGNESIUM: 2.1 mg/dL (ref 1.7–2.4)

## 2015-02-05 LAB — HIV ANTIBODY (ROUTINE TESTING W REFLEX): HIV Screen 4th Generation wRfx: NONREACTIVE

## 2015-02-05 LAB — TSH: TSH: 1.377 u[IU]/mL (ref 0.350–4.500)

## 2015-02-05 MED ORDER — ONDANSETRON HCL 4 MG PO TABS: 4.0000 mg | ORAL_TABLET | Freq: Three times a day (TID) | ORAL | Status: DC | PRN

## 2015-02-05 MED ORDER — MAGNESIUM HYDROXIDE 400 MG/5ML PO SUSP: 30.0000 mL | Freq: Every day | ORAL | Status: DC | PRN

## 2015-02-05 MED ORDER — NICOTINE 14 MG/24HR TD PT24
14.0000 mg | MEDICATED_PATCH | Freq: Every day | TRANSDERMAL | Status: DC
  Administered 2015-02-05: 14 mg via TRANSDERMAL
  Filled 2015-02-05: qty 1

## 2015-02-05 MED ORDER — NICOTINE 21 MG/24HR TD PT24
21.0000 mg | MEDICATED_PATCH | Freq: Every day | TRANSDERMAL | Status: DC
  Administered 2015-02-06: 21 mg via TRANSDERMAL
  Filled 2015-02-05: qty 1

## 2015-02-05 MED ORDER — ALUM & MAG HYDROXIDE-SIMETH 200-200-20 MG/5ML PO SUSP: 30.0000 mL | ORAL | Status: DC | PRN

## 2015-02-05 MED ORDER — ACETAMINOPHEN 325 MG PO TABS: 650.0000 mg | ORAL_TABLET | Freq: Four times a day (QID) | ORAL | Status: DC | PRN

## 2015-02-05 MED ORDER — TRAZODONE HCL 100 MG PO TABS
100.0000 mg | ORAL_TABLET | Freq: Every evening | ORAL | Status: DC | PRN
Start: 2015-02-05 — End: 2015-02-06

## 2015-02-05 NOTE — Care Management Note (Signed)
Case Management Note  Patient Details  Name: Spencer Hale MRN: [id number] Date of Birth: 04/24/1990  Subjective/Objective:    Patient for dc to inpatient psych today.  Psych CSW following.                Action/Plan:   Expected Discharge Date:                  Expected Discharge Plan:  Psychiatric Hospital  In-House Referral:     Discharge planning Services  CM Consult  Post Acute Care Choice:    Choice offered to:     DME Arranged:    DME Agency:     HH Arranged:    HH Agency:     Status of Service:  Completed, signed off  Medicare Important Message Given:    Date Medicare IM Given:    Medicare IM give by:    Date Additional Medicare IM Given:    Additional Medicare Important Message give by:     If discussed at Long Length of Stay Meetings, dates discussed:    Additional Comments:  Spencer Hale, Spencer Matson Clinton, RN 02/05/2015, 3:26 PM

## 2015-02-05 NOTE — Progress Notes (Signed)
D: patient was a direct admit from [facility] after a overdose on his medications.  Patient states that he is not feeling suicidal at this moment.  Patient verbally contracts for safety.  Patient has a hx of depression and ADHD.  Patient states that he has had frequent fights with his gf that has worsen his depression and the mother of his child will not let him see the child.  Patient in no distress at this time A: patient shown around the unit.  Skin and belongings search completed with no contraband found.  Support and encouragement provided.   R: patient receptive of information given

## 2015-02-05 NOTE — Progress Notes (Signed)
Pt requesting a nicotine patch. Made Dr Tat aware.

## 2015-02-05 NOTE — Progress Notes (Signed)
   02/05/15 1600  Clinical Encounter Type  Visited With Patient  Visit Type Spiritual support  Referral From Nurse  Spiritual Encounters  Spiritual Needs Prayer;Emotional  Stress Factors  Patient Stress Factors Major life changes;Family relationships;Financial concerns  Patient reported suicidal thoughts. Spoke to nurse before visit to ascertain more background on this. Nurse reported overdose and family issues. Spent about 30 minutes with patient and prayed with him. He expressed confidence about his life going forward.

## 2015-02-05 NOTE — Discharge Summary (Signed)
Physician Discharge Summary  Spencer Hale AVW:[id number] DOB: Aug 07, 1990 DOA: 02/04/2015  PCP: No PCP Per Patient  Admit date: 02/04/2015 Discharge date: 02/05/2015   Discharge Diagnoses:  Intentional Overdose/Suicide attempt -psychiatry has been consulted--recommended inpatient psychiatric services -one-on-one sitter -monitor for QT prolongation--the patient's QTC is measured by EKG remained stable through the hospitalization -Urine drug screen is negative -Repeat EKG in the morning--no concerning ischemic changes -Repeat BMP --unremarkable  Tobacco Abuse -Tobacco cessation discussed -NicoDerm patch deferred  Nausea/vomiting -Hepatic enzymes negative -check lipase--33 -IVF -After approx 24 hours, the patient's nausea and vomiting improved -Patient's diet was advanced which he tolerated  Abdominal pain -resolved -improved after BM -Diet advanced. Patient tolerated diet  Leukocytosis -Afebrile and hemodynamically stable -Remained stable and afebrile off antibiotics -UA--no pyuria, no proteinuria -Improved. WBC 7.1 on the day of discharge   Discharge Condition: stable  Disposition: behavioral health  Diet:regular Wt Readings from Last 3 Encounters:  02/04/15 56.7 kg (125 lb)    History of present illness:  24 year old male with a history of depression and no other chronic medical problems presented with intentional drug overdose as part of the suicide attempt. The patient feels depressed because he has lost his job approximately 2 months ago and has not been able to find new work. He states that he took his girlfriends pills, approximately 30 pills total around 10:30 PM on 02/03/2015. Patient was not forthcoming regarding exactly what pills he took, but from the medical record, it appears that he took Abilify, fluoxetine, and Celexa. He denies any headache, chest pain or shortness breath, diarrhea, fevers, chills. He had an episode of nausea and vomiting since  admission but denies any abdominal pain, dysuria, hematuria. He smokes approximately 1 pack every 3 days. He denies any alcohol or other illegal drug use. Initially after admission, the patient had nausea and vomiting. This was likely due to the patient's drug overdose. His LFTs and lipase were unremarkable. The patient was started on intravenous fluids. He was placed on bowel rest. He gradually improved and his diet was advanced which he tolerated.  Consultants: psychiatry  Discharge Exam: Filed Vitals:   02/05/15 0522  BP: 137/74  Pulse: 95  Temp: 99.1 F (37.3 C)  Resp: 18   Filed Vitals:   02/04/15 0606 02/04/15 1736 02/04/15 2138 02/05/15 0522  BP: 147/84 119/60 153/87 137/74  Pulse: 88 85 56 95  Temp: 98.2 F (36.8 C) 98.7 F (37.1 C) 99.3 F (37.4 C) 99.1 F (37.3 C)  TempSrc: Oral  Oral Oral  Resp: Height: 5' 4.8" (1.646 m)     Weight: 56.7 kg (125 lb)     SpO2: 100% 98% 94% 98%   General: A&O x 3, NAD, pleasant, cooperative Cardiovascular: RRR, no rub, no gallop, no S3 Respiratory: CTAB, no wheeze, no rhonchi Abdomen:soft, nontender, nondistended, positive bowel sounds Extremities: No edema, No lymphangitis, no petechiae  Discharge Instructions      Discharge Instructions    Diet - low sodium heart healthy    Complete by:  As directed      Increase activity slowly    Complete by:  As directed             Medication List    STOP taking these medications        ibuprofen 200 MG tablet  Commonly known as:  ADVIL,MOTRIN         The results of significant diagnostics from this hospitalization (including imaging, microbiology, ancillary  and laboratory) are listed below for reference.    Significant Diagnostic Studies: Dg Abd 2 Views  02/04/2015  CLINICAL DATA:  Vomiting. EXAM: ABDOMEN - 2 VIEW COMPARISON:  None. FINDINGS: Supine and upright views of the abdomen are provided. Overall bowel gas pattern is nonobstructive. No evidence of  soft tissue mass or abnormal fluid collection seen. No evidence of free intraperitoneal air seen. No pathologic - appearing calcifications. No osseous abnormality. Lung bases are clear. IMPRESSION: Nonobstructive bowel gas pattern and no evidence of acute intra-abdominal abnormality. Electronically Signed   By: Bary RichardStan  Maynard M.D.   On: 02/04/2015 16:40     Microbiology: No results found for this or any previous visit (from the past 240 hour(s)).   Labs: Basic Metabolic Panel:  Recent Labs Lab 02/04/15 0240 02/05/15 0532  NA 138 140  K 4.2 4.0  CL 107 107  CO2 24 26  GLUCOSE 117* 105*  BUN 11 11  CREATININE 0.87 0.84  CALCIUM 9.2 9.4  MG 1.8 2.1   Liver Function Tests:  Recent Labs Lab 02/04/15 0240 02/05/15 0532  AST 22 18  ALT 13* 13*  ALKPHOS 57 47  BILITOT 0.4 0.5  PROT 6.7 6.7  ALBUMIN 4.2 4.0    Recent Labs Lab 02/05/15 0532  LIPASE 33   No results for input(s): AMMONIA in the last 168 hours. CBC:  Recent Labs Lab 02/04/15 0240 02/05/15 0532  WBC 11.6* 7.1  NEUTROABS 7.8*  --   HGB 13.8 13.3  HCT 39.6 38.6*  MCV 85.3 86.4  PLT 319 302   Cardiac Enzymes: No results for input(s): CKTOTAL, CKMB, CKMBINDEX, TROPONINI in the last 168 hours. BNP: Invalid input(s): POCBNP CBG:  Recent Labs Lab 02/04/15 0305  GLUCAP 121*    Time coordinating discharge:  Greater than 30 minutes  Signed:  Dejohn Ibarra, DO [facility] Pager: [phone] 02/05/2015, 12:17 PM

## 2015-02-05 NOTE — Clinical Social Work Psych Note (Signed)
Psych CSW met with patient to discuss disposition.  Patient states he feels that he needs psychiatric admission at this time.  Psychiatry evaluated the patient and recommends inpatient psychiatric admission.  Per charge RN, MD reports patient is medically stable and ready for [facility] admission.  Psych CSW now initiating referrals.  Nonnie Done, LCSW [phone]  [facility] Licensed Clinical Social Worker

## 2015-02-05 NOTE — Progress Notes (Signed)
Patient states that he is missing a cell phone that [facility] Nurse, Cyndi, called. She said she would check with security, but as far as she knew he had two bags of belongings that [facility]. When bags searched here, however, there was no cell phone. Continue to monitor.

## 2015-02-05 NOTE — Progress Notes (Signed)
NURSING PROGRESS NOTE  Spencer Hale [id number] Discharge Data: 02/05/2015 3:28 PM Attending Provider: Catarina Hartshornavid Tat, MD PCP:No PCP Per Patient     [facility]  Discussed with the patient the After Visit Summary and all questions fully answered. All IV's discontinued with no bleeding noted. All belongings returned to patient for patient to take to behavioral health. [facility] who will be receiving pt. All questions answered.  Last Vital Signs:  Blood pressure 140/82, pulse 96, temperature 97.8 F (36.6 C), temperature source Oral, resp. rate 19, height 5' 4.8" (1.646 m), weight 56.7 kg (125 lb), SpO2 97 %.  Discharge Medication List   Medication List    STOP taking these medications        ibuprofen 200 MG tablet  Commonly known as:  ADVIL,MOTRIN         Bennie Pieriniyndi Hether Anselmo, RN

## 2015-02-05 NOTE — Consult Note (Signed)
[place] [facility] Consult   Reason for Consult:  Depression, suicide attempt by overdose Referring Physician: Dr. Shanon Brow Tat Patient Identification: Spencer Hale MRN:  [id number] Principal Diagnosis: Intentional drug overdose [facility]) Diagnosis:   Patient Active Problem List   Diagnosis Date Noted  . Intentional drug overdose ([place]) [T50.902A] 02/04/2015  . Overdose of antidepressant [T43.201A] 02/04/2015  . Tobacco abuse [Z72.0] 02/04/2015  . Abdominal pain [R10.9] 02/04/2015  . Suicide attempt ([place]) [T14.91] 02/04/2015  . Major depressive disorder, single episode, severe without psychotic features ([place]) [F32.2]     Total Time spent with patient: 30 minutes  Subjective:   Spencer Hale is a 24 y.o. male patient admitted with suicide attempt by overdosing on multiple medications.  HPI: Thanks for asking me to do a psychiatric consultation on Spencer Hale,  a 24 y.o. Man, single, unemployed who reports history of ADHD and Depression. Patient was brought to the Emergency Department after he attempted suicide by overdosing on his girlfriend's medications. Patient reports that he was having a heated argument with his girlfriend last night, things got out of hand and he overdosed on 30 pills of Abilify, Celexa, and Prozac. Patient reports worsening depressive symptoms for the past 2 months after losing his job and has not been able to find another one. He is stressed out due to lack of money to pay for child support and recurrent arguments with his current girlfriend. Patient  reports frequent crying episodes, hopelessness, poor appetite, difficulty sleeping and recurrent suicidal thoughts. He  denies delusional thinking, auditory/visual hallucinations, drugs or alcohol abuse. He needs psychiatric inpatient admission, he is unable to contract for safety.  Past Psychiatric History: ADHD  Interval history: Patient seen for psychiatric consultation follow-up today and reviewed initial  psychiatric consultation and evaluation by Dr. Darleene Cleaver. Patient appeared awake, alert, oriented to time place person and situation. Patient reported this is his first suicidal attempt after lost his job, unable to find a new job after reaching several places during the last month, frustrated, disappointed, scared about incarceration secondary to not able to pay his child support. Patient has previously incarcerated for missing his child support. Patient is also worried about not able to see 39 years old child because of restraining orders against him from child's mother. Patient reportedly taken intentional overdose of his girlfriend's psychiatric medication with intention to end his life. Patient presented initial stomach upset, nausea and vomiting's but currently has limited effects of the intoxication and withdrawal symptoms. Patient continued to endorse suicidal ideation but denies homicidal ideation and has no evidence of psychotic symptoms. Patient has no history of alcohol or drug of abuse versus dependence. Patient is voluntarily to inpatient psychiatric hospitalization at this time. Patient has a [employer] cabin crew. Case discussed with the psychiatric social service regarding appropriate inpatient psychiatric placement. Reportedly patient is asking staff nurse regarding his girlfriend who was admitted to different hospital with intentional overdose of medication. Patient and his girlfriend cannot be placed at behavioral [place] at the same time.  Risk to Self: Is patient at risk for suicide?: Yes Risk to Others:   Prior Inpatient Therapy:   Prior Outpatient Therapy:    Past Medical History:  Past Medical History  Diagnosis Date  . Depression     Past Surgical History  Procedure Laterality Date  . No past surgeries     Family History: History reviewed. No pertinent family history. Family Psychiatric  History: Mother and maternal grandmother suffers from depression Social History:  History  Alcohol Use  . Yes    Comment: [employer] hard lemonade on weekends     History  Drug Use No    Social History   Social History  . Marital Status: Single    Spouse Name: N/A  . Number of Children: N/A  . Years of Education: N/A   Social History Main Topics  . Smoking status: Current Every Day Smoker    Types: Cigarettes  . Smokeless tobacco: None  . Alcohol Use: Yes     Comment: [employer] hard lemonade on weekends  . Drug Use: No  . Sexual Activity: Not Asked   Other Topics Concern  . None   Social History Narrative  . None   Additional Social History:                          Allergies:  No Known Allergies  Labs:  Results for orders placed or performed during the hospital encounter of 02/04/15 (from the past 48 hour(s))  Comprehensive metabolic panel     Status: Abnormal   Collection Time: 02/04/15  2:40 AM  Result Value Ref Range   Sodium 138 135 - 145 mmol/L   Potassium 4.2 3.5 - 5.1 mmol/L   Chloride 107 101 - 111 mmol/L   CO2 24 22 - 32 mmol/L   Glucose, Bld 117 (H) 65 - 99 mg/dL   BUN 11 6 - 20 mg/dL   Creatinine, Ser 0.87 0.61 - 1.24 mg/dL   Calcium 9.2 8.9 - 10.3 mg/dL   Total Protein 6.7 6.5 - 8.1 g/dL   Albumin 4.2 3.5 - 5.0 g/dL   AST 22 15 - 41 U/L   ALT 13 (L) 17 - 63 U/L   Alkaline Phosphatase 57 38 - 126 U/L   Total Bilirubin 0.4 0.3 - 1.2 mg/dL   GFR calc non Af Amer >60 >60 mL/min   GFR calc Af Amer >60 >60 mL/min    Comment: (NOTE) The eGFR has been calculated using the CKD EPI equation. This calculation has not been validated in all clinical situations. eGFR's persistently <60 mL/min signify possible Chronic Kidney Disease.    Anion gap 7 5 - 15  Ethanol (ETOH)     Status: None   Collection Time: 02/04/15  2:40 AM  Result Value Ref Range   Alcohol, Ethyl (B) <5 <5 mg/dL    Comment:        LOWEST DETECTABLE LIMIT FOR SERUM ALCOHOL IS 5 mg/dL FOR MEDICAL PURPOSES ONLY   Salicylate level     Status: None   Collection Time:  02/04/15  2:40 AM  Result Value Ref Range   Salicylate Lvl <4.6 2.8 - 30.0 mg/dL  Acetaminophen level     Status: Abnormal   Collection Time: 02/04/15  2:40 AM  Result Value Ref Range   Acetaminophen (Tylenol), Serum <10 (L) 10 - 30 ug/mL    Comment:        THERAPEUTIC CONCENTRATIONS VARY SIGNIFICANTLY. A RANGE OF 10-30 ug/mL MAY BE AN EFFECTIVE CONCENTRATION FOR MANY PATIENTS. HOWEVER, SOME ARE BEST TREATED AT CONCENTRATIONS OUTSIDE THIS RANGE. ACETAMINOPHEN CONCENTRATIONS >150 ug/mL AT 4 HOURS AFTER INGESTION AND >50 ug/mL AT 12 HOURS AFTER INGESTION ARE OFTEN ASSOCIATED WITH TOXIC REACTIONS.   CBC     Status: Abnormal   Collection Time: 02/04/15  2:40 AM  Result Value Ref Range   WBC 11.6 (H) 4.0 - 10.5 K/uL   RBC 4.64 4.22 - 5.81 MIL/uL  Hemoglobin 13.8 13.0 - 17.0 g/dL   HCT 39.6 39.0 - 52.0 %   MCV 85.3 78.0 - 100.0 fL   MCH 29.7 26.0 - 34.0 pg   MCHC 34.8 30.0 - 36.0 g/dL   RDW 12.8 11.5 - 15.5 %   Platelets 319 150 - 400 K/uL  Magnesium     Status: None   Collection Time: 02/04/15  2:40 AM  Result Value Ref Range   Magnesium 1.8 1.7 - 2.4 mg/dL  Differential     Status: Abnormal   Collection Time: 02/04/15  2:40 AM  Result Value Ref Range   Neutrophils Relative % 67 %   Neutro Abs 7.8 (H) 1.7 - 7.7 K/uL   Lymphocytes Relative 24 %   Lymphs Abs 2.8 0.7 - 4.0 K/uL   Monocytes Relative 6 %   Monocytes Absolute 0.7 0.1 - 1.0 K/uL   Eosinophils Relative 2 %   Eosinophils Absolute 0.3 0.0 - 0.7 K/uL   Basophils Relative 1 %   Basophils Absolute 0.1 0.0 - 0.1 K/uL  Urine rapid drug screen (hosp performed) (Not at [facility])     Status: None   Collection Time: 02/04/15  2:59 AM  Result Value Ref Range   Opiates NONE DETECTED NONE DETECTED   Cocaine NONE DETECTED NONE DETECTED   Benzodiazepines NONE DETECTED NONE DETECTED   Amphetamines NONE DETECTED NONE DETECTED   Tetrahydrocannabinol NONE DETECTED NONE DETECTED   Barbiturates NONE DETECTED NONE DETECTED     Comment:        DRUG SCREEN FOR MEDICAL PURPOSES ONLY.  IF CONFIRMATION IS NEEDED FOR ANY PURPOSE, NOTIFY LAB WITHIN 5 DAYS.        LOWEST DETECTABLE LIMITS FOR URINE DRUG SCREEN Drug Class       Cutoff (ng/mL) Amphetamine      1000 Barbiturate      200 Benzodiazepine   284 Tricyclics       132 Opiates          300 Cocaine          300 THC              50   CBG monitoring, ED     Status: Abnormal   Collection Time: 02/04/15  3:05 AM  Result Value Ref Range   Glucose-Capillary 121 (H) 65 - 99 mg/dL  Basic metabolic panel     Status: Abnormal   Collection Time: 02/05/15  5:32 AM  Result Value Ref Range   Sodium 140 135 - 145 mmol/L   Potassium 4.0 3.5 - 5.1 mmol/L   Chloride 107 101 - 111 mmol/L   CO2 26 22 - 32 mmol/L   Glucose, Bld 105 (H) 65 - 99 mg/dL   BUN 11 6 - 20 mg/dL   Creatinine, Ser 0.84 0.61 - 1.24 mg/dL   Calcium 9.4 8.9 - 10.3 mg/dL   GFR calc non Af Amer >60 >60 mL/min   GFR calc Af Amer >60 >60 mL/min    Comment: (NOTE) The eGFR has been calculated using the CKD EPI equation. This calculation has not been validated in all clinical situations. eGFR's persistently <60 mL/min signify possible Chronic Kidney Disease.    Anion gap 7 5 - 15  CBC     Status: Abnormal   Collection Time: 02/05/15  5:32 AM  Result Value Ref Range   WBC 7.1 4.0 - 10.5 K/uL   RBC 4.47 4.22 - 5.81 MIL/uL   Hemoglobin 13.3 13.0 - 17.0 g/dL  HCT 38.6 (L) 39.0 - 52.0 %   MCV 86.4 78.0 - 100.0 fL   MCH 29.8 26.0 - 34.0 pg   MCHC 34.5 30.0 - 36.0 g/dL   RDW 13.1 11.5 - 15.5 %   Platelets 302 150 - 400 K/uL  Magnesium     Status: None   Collection Time: 02/05/15  5:32 AM  Result Value Ref Range   Magnesium 2.1 1.7 - 2.4 mg/dL  Lipase, blood     Status: None   Collection Time: 02/05/15  5:32 AM  Result Value Ref Range   Lipase 33 11 - 51 U/L  Hepatic function panel     Status: Abnormal   Collection Time: 02/05/15  5:32 AM  Result Value Ref Range   Total Protein 6.7 6.5 -  8.1 g/dL   Albumin 4.0 3.5 - 5.0 g/dL   AST 18 15 - 41 U/L   ALT 13 (L) 17 - 63 U/L   Alkaline Phosphatase 47 38 - 126 U/L   Total Bilirubin 0.5 0.3 - 1.2 mg/dL   Bilirubin, Direct <0.1 (L) 0.1 - 0.5 mg/dL   Indirect Bilirubin NOT CALCULATED 0.3 - 0.9 mg/dL    Current Facility-Administered Medications  Medication Dose Route Frequency Provider Last Rate Last Dose  . enoxaparin (LOVENOX) injection 40 mg  40 mg Subcutaneous Daily Norval Morton, MD   40 mg at 02/04/15 1010  . LORazepam (ATIVAN) injection 1 mg  1 mg Intravenous Q4H PRN Norval Morton, MD      . metoCLOPramide (REGLAN) injection 5 mg  5 mg Intravenous 3 times per day Orson Eva, MD   5 mg at 02/05/15 6010  . ondansetron (ZOFRAN) injection 4 mg  4 mg Intravenous Q6H PRN Orson Eva, MD   4 mg at 02/04/15 1336  . sodium chloride 0.9 % 1,000 mL with potassium chloride 20 mEq infusion   Intravenous Continuous Orson Eva, MD 75 mL/hr at 02/05/15 0508    . sodium chloride 0.9 % injection 3 mL  3 mL Intravenous Q12H Norval Morton, MD   3 mL at 02/04/15 2210    Musculoskeletal: Strength & Muscle Tone: within normal limits Gait & Station: normal Patient leans: N/A  Psychiatric Specialty Exam: Review of Systems   Blood pressure 137/74, pulse 95, temperature 99.1 F (37.3 C), temperature source Oral, resp. rate 18, height 5' 4.8" (1.646 m), weight 56.7 kg (125 lb), SpO2 98 %.Body mass index is 20.93 kg/(m^2).  General Appearance: Casual  Eye Contact::  Minimal  Speech:  Clear and Coherent  Volume:  Decreased  Mood:  Depressed, Dysphoric and Hopeless  Affect:  Constricted  Thought Process:  Goal Directed  Orientation:  Full (Time, Place, and Person)  Thought Content:  Negative  Suicidal Thoughts:  Yes.  without intent/plan  Homicidal Thoughts:  No  Memory:  Immediate;   Good Recent;   Good Remote;   Good  Judgement:  Impaired  Insight:  Lacking  Psychomotor Activity:  Decreased  Concentration:  Fair  Recall:  Good   Fund of Knowledge:Good  Language: Good  Akathisia:  No  Handed:  Right  AIMS (if indicated):     Assets:  Communication Skills Desire for Improvement Physical Health Social Support  ADL's:  Intact  Cognition: WNL  Sleep:   poor   Treatment Plan Summary: Daily contact with patient to assess and evaluate symptoms and progress in treatment: Medication management; Please consider antidepressant treatment when medically cleared.  Disposition:  Recommend  psychiatric Inpatient admission when medically cleared. Supportive therapy provided about ongoing stressors. Case discussed with the psychiatric social service and Psychiatric social worker to assist with placing patient in inpatient psychiatric facility when he is medically stable.  Durward Parcel., MD 02/05/2015 9:33 AM

## 2015-02-05 NOTE — BH Assessment (Signed)
Pending review for possible placement with [facility].  

## 2015-02-05 NOTE — Progress Notes (Signed)
Patient's cell phone (black) and wallet were [facility] to the Unit and added to patient's belonging in his locker.

## 2015-02-05 NOTE — BH Assessment (Signed)
Pt. has been accepted to [facility]. Accepting physician is Dr. Ardyth HarpsHernandez. Attending Physician will be Dr. Jennet MaduroPucilowska. Pt. has been assigned to room 303, [facility] Nurse Jerry CarasWendy S. Call report to [phone]. Representative/Transfer Coordinator is Jeet Shough.  [facility] Staff (Vickii PennaGina Ingle., Medical Floor Social Worker) made aware of acceptance.

## 2015-02-05 NOTE — Tx Team (Signed)
Initial Interdisciplinary Treatment Plan   PATIENT STRESSORS: Financial difficulties Loss of job Marital or family conflict Substance abuse   PATIENT STRENGTHS: Ability for insight Motivation for treatment/growth Physical Health   PROBLEM LIST: Problem List/Patient Goals Date to be addressed Date deferred Reason deferred Estimated date of resolution  MDD  02/05/2015     Intentional drug overdose  02/05/2015                                                DISCHARGE CRITERIA:  Improved stabilization in mood, thinking, and/or behavior Need for constant or close observation no longer present Reduction of life-threatening or endangering symptoms to within safe limits  PRELIMINARY DISCHARGE PLAN: Return to previous living arrangement  PATIENT/FAMIILY INVOLVEMENT: This treatment plan has been presented to and reviewed with the patient, Spencer Hale.  The patient and family have been given the opportunity to ask questions and make suggestions.  Crissie ReeseHeather L Hezekiah Veltre 02/05/2015, 6:20 PM

## 2015-02-05 NOTE — Clinical Social Work Psych Note (Signed)
Psych CSW was notified that patient has been accepted to [facility].  Patient is voluntary.  Voluntary form signed and faxed to [phone]. RN to call report to [phone] after bed assignment has been received  Transportation: [employer] [phone] (voluntary form to be transported with patient-no packet needed) to be scheduled after bed assignment received.  CSW will update RN once bed assignment has been received.      Vickii PennaGina Lexus Shampine, LCSW [phone]  [facility] Licensed Clinical Social Worker

## 2015-02-05 NOTE — Progress Notes (Signed)
A&Ox3, VSS, denied pain, denied SI/SIB, denied AV/H, appearance appropriate, great insight, mood and affect sad, able to talk about his struggles with "Depression: I don't have any more access to medications after I lost my job 2 months ago...Marland Kitchen.Marland Kitchen." Evasive about his living arrangement with his Girlfriend; room closer to the nurses' station for frequent monitoring.

## 2015-02-05 NOTE — Progress Notes (Signed)
Patients cell phone [facility] been [facility].  Patient was instructed to pick up after d/c or have mother pick it up.

## 2015-02-06 DIAGNOSIS — F4321 Adjustment disorder with depressed mood: Principal | ICD-10-CM

## 2015-02-06 NOTE — [facility] Suicide Risk Assessment (Signed)
[facility] INPATIENT:  Family/Significant Other Suicide Prevention Education  Suicide Prevention Education:  Education Completed; Fredric Maremanda Lovelace (mother) [phone] has been identified by the patient as the family member/significant other with whom the patient will be residing, and identified as the person(s) who will aid the patient in the event of a mental health crisis (suicidal ideations/suicide attempt).  With written consent from the patient, the family member/significant other has been provided the following suicide prevention education, prior to the and/or following the discharge of the patient.  The suicide prevention education provided includes the following:  Suicide risk factors  Suicide prevention and interventions  National Suicide Hotline telephone number  [facility] assessment telephone number  [facility] Emergency Assistance 911  [facility] and/or Residential Mobile Crisis Unit telephone number  Request made of family/significant other to:  Remove weapons (e.g., guns, rifles, knives), all items previously/currently identified as safety concern.    Remove drugs/medications (over-the-counter, prescriptions, illicit drugs), all items previously/currently identified as a safety concern.  The family member/significant other verbalizes understanding of the suicide prevention education information provided.  The family member/significant other agrees to remove the items of safety concern listed above.  Lulu RidingIngle, Tayvian Holycross T, MSW, LCSWA 02/06/2015, 10:22 AM

## 2015-02-06 NOTE — Progress Notes (Signed)
  [facility] Discharge [facility] :  Will you be returning to the same living situation after discharge:  Yes,  home with girlfriend At discharge, do you have transportation home?: No. Patient [place] [facility] and [place] to [place]'s home Do you have the ability to pay for your medications: No. [facility] who has medication closet to assist with patient meds  Release of information consent forms completed and in the chart;  Patient's signature needed at discharge.  Patient to Follow up at: Follow-up Information    Follow up with [facility]. Go on 02/09/2015.   Why:  For follow-up care appt Friday 02/09/15 at 8:00am (walk in appts M-F 8-4)   Contact information:   [address] [place], [place] Ph [phone] Fax [phone]       Call [facility].   Why:  As needed, patient may call to schedule an appointment   Contact information:   [address] [place], [place] [address] Ph [phone]      Next level of care provider has access to [facility] Link:no  Patient denies SI/HI: Yes,  patient denies SI/HI    Safety Planning and Suicide Prevention discussed: Yes,  SPE discussed with patient and his mother Fredric Maremanda Lovelace [phone]   Have you used any form of tobacco in the last 30 days? (Cigarettes, Smokeless Tobacco, Cigars, and/or Pipes): Yes  Has patient been referred to the Quitline?: Yes, faxed on 02/06/15  Lulu RidingIngle, Shi Grose T, MSW, LCSWA 02/06/2015, 1:55 PM

## 2015-02-06 NOTE — H&P (Signed)
Psychiatric Admission Assessment Adult  Patient Identification: Spencer Hale MRN:  [id number] Date of Evaluation:  02/06/2015 Chief Complaint:  depression Principal Diagnosis: Adjustment disorder with depressed mood Diagnosis:   Patient Active Problem List   Diagnosis Date Noted  . Intentional drug overdose (HCC) [T50.902A] 02/04/2015  . Tobacco use disorder [F17.200] 02/04/2015  . Abdominal pain [R10.9] 02/04/2015  . Adjustment disorder with depressed mood [F43.21]    History of Present Illness:  Identifying data. Spencer Hale is a 24 year old male with no prior psychiatric history.   Chief complaint. "This was stupid."   History of present illness. Information was obtained from the patient and the chart. The patient does not have history of mental illness however for the past past 2 months he's seen increasingly stressed out after he lost his job of 8 months. He was unable to pay his child support and had to live off his girlfriend. On the night of admission he was arguing with his girlfriend then they decided to get themselves together. He overdosed on medications prescribed for the girlfriend. Somehow they called the ambulance and were both thought to the emergency room of [facility]. The girlfriend was admitted there. The patient was brought to [facility]. He now feels ashamed of his action. He was able to talk to his mother and is moving in with her. He spoke with her mother of his 25-year-old son to learn that she will allow him to see his child now. He also wants to break up with a girlfriend feeling that she has been toxic. He denies any symptoms of depression, anxiety, or psychosis. He denies symptoms suggestive of bipolar mania. He denies any thoughts of hurting himself or others. He is a cigarette smoker but does not use any drugs or alcohol.  Past psychiatric history. He was diagnosed with ADHD in his childhood and was taking Ritalin with success.  This stopped when he lost his Medicaid. He never attempted suicide and has never been hospitalized. No medication trials other than ADD medication.   Family psychiatric history. Mother with depression and heroine addiction. She attempted suicide not long ago. Sister with bipolar.   Social history. He graduated from high school and almost completed Orthoptist courses at the [employer]. He had to quit school and as he needed to get a job to pay his child support. 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 He has a 57-year-old son. He used to live with a girlfriend but this no longer is a plan. He will move in with his mother.   Total Time spent with patient: 1 hour  Past Psychiatric History: None reported.  Risk to Self: Is patient at risk for suicide?: No Risk to Others:   Prior Inpatient Therapy:   Prior Outpatient Therapy:    Alcohol Screening: 1. How often do you have a drink containing alcohol?: Monthly or less 2. How many drinks containing alcohol do you have on a typical day when you are drinking?: 3 or 4 3. How often do you have six or more drinks on one occasion?: Never Preliminary Score: 1 4. How often during the last year have you found that you were not able to stop drinking once you had started?: Never 5. How often during the last year have you failed to do what was normally expected from you becasue of drinking?: Never 6. How often  during the last year have you needed a first drink in the morning to get yourself going after a heavy drinking session?: Never 7. How often during the last year have you had a feeling of guilt of remorse after drinking?: Never 8. How often during the last year have you been unable to remember what happened the night before because you had been drinking?: Never 9. Have you or someone else been injured as a result of your drinking?: No 10. Has a relative or friend or a doctor or another  health worker been concerned about your drinking or suggested you cut down?: No Alcohol Use Disorder Identification Test Final Score (AUDIT): 2 Brief Intervention: AUDIT score less than 7 or less-screening does not suggest unhealthy drinking-brief intervention not indicated Substance Abuse History in the last 12 months:  No. Consequences of Substance Abuse: NA Previous Psychotropic Medications: No  Psychological Evaluations: No  Past Medical History:  Past Medical History  Diagnosis Date  . Depression     Past Surgical History  Procedure Laterality Date  . No past surgeries     Family History: History reviewed. No pertinent family history. Family Psychiatric  History: Mother with depression, sister with bipolar.  Social History:  History  Alcohol Use  . Yes    Comment: Spencer Hale on weekends     History  Drug Use No    Social History   Social History  . Marital Status: Single    Spouse Name: N/A  . Number of Children: N/A  . Years of Education: N/A   Social History Main Topics  . Smoking status: Current Every Day Smoker    Types: Cigarettes  . Smokeless tobacco: None  . Alcohol Use: Yes     Comment: Spencer Hale on weekends  . Drug Use: No  . Sexual Activity: Not Asked   Other Topics Concern  . None   Social History Narrative   Additional Social History:                         Allergies:  No Known Allergies Lab Results:  Results for orders placed or performed during the hospital encounter of 02/05/15 (from the past 48 hour(s))  TSH     Status: None   Collection Time: 02/05/15  9:42 PM  Result Value Ref Range   TSH 1.377 0.350 - 4.500 uIU/mL    Metabolic Disorder Labs:  No results found for: HGBA1C, MPG No results found for: PROLACTIN No results found for: CHOL, TRIG, HDL, CHOLHDL, VLDL, LDLCALC  Current Medications: Current Facility-Administered Medications  Medication Dose Route Frequency Provider Last Rate Last Dose   . acetaminophen (TYLENOL) tablet 650 mg  650 mg Oral Q6H PRN Jimmy FootmanAndrea Hernandez-Gonzalez, MD      . alum & mag hydroxide-simeth (MAALOX/MYLANTA) 200-200-20 MG/5ML suspension 30 mL  30 mL Oral Q4H PRN Jimmy FootmanAndrea Hernandez-Gonzalez, MD      . magnesium hydroxide (MILK OF MAGNESIA) suspension 30 mL  30 mL Oral Daily PRN Jimmy FootmanAndrea Hernandez-Gonzalez, MD      . nicotine (NICODERM CQ - dosed in mg/24 hours) patch 21 mg  21 mg Transdermal Q0600 Jimmy FootmanAndrea Hernandez-Gonzalez, MD   21 mg at 02/06/15 0615  . ondansetron (ZOFRAN) tablet 4 mg  4 mg Oral Q8H PRN Jimmy FootmanAndrea Hernandez-Gonzalez, MD      . traZODone (DESYREL) tablet 100 mg  100 mg Oral QHS PRN Jimmy FootmanAndrea Hernandez-Gonzalez, MD       PTA Medications: Prescriptions  prior to admission  Medication Sig Dispense Refill Last Dose  . ibuprofen (ADVIL,MOTRIN) 200 MG tablet Take 200 mg by mouth every 6 (six) hours as needed for mild pain.   Past Week at Unknown time    Musculoskeletal: Strength & Muscle Tone: within normal limits Gait & Station: normal Patient leans: N/A  Psychiatric Specialty Exam: Physical Exam  Nursing note and vitals reviewed.   Review of Systems  Gastrointestinal: Positive for nausea.  All other systems reviewed and are negative.   Blood pressure 138/92, pulse 86, temperature 98.5 F (36.9 C), temperature source Oral, resp. rate 20, height  (1.702 m), weight 57.153 kg (126 lb), SpO2 98 %.Body mass index is 19.73 kg/(m^2).  See SRA.                                                  Sleep:  Number of Hours: 7     Treatment Plan Summary: Daily contact with patient to assess and evaluate symptoms and progress in treatment and Medication management   Spencer Hale is a 24 year old male with no past psychiatric history transferred from [facility] after suicide attempt by medication overdose.  1. Suicidal ideation. This has resolved. The patient adamantly denies any thoughts intentions or plans to hurt himself or  others. He is able to contract for safety.  2. Mood. The patient denies any history of depression. He started feeling about 2 months ago when he lost his job. He is not interested in pharmacotherapy. He will follow up with a therapist  3. Smoking. Nicotine patch was available.  4. Status post overdose. He experienced some nausea this has resolved now.  5 disposition. He was discharged to home with his mother. He will follow up with [facility]..    Observation Level/Precautions:  15 minute checks  Laboratory:  CBC Chemistry Profile UDS UA  Psychotherapy:    Medications:    Consultations:    Discharge Concerns:    Estimated LOS:  Other:     I certify that inpatient services furnished can reasonably be expected to improve the patient's condition.   Spencer Hale 11/15/201610:01 AM

## 2015-02-06 NOTE — [facility] Suicide Risk Assessment (Signed)
[facility]   Demographic Factors:  Adolescent or young adult, Caucasian and Unemployed  Total Time spent with patient: 1 hour  Musculoskeletal: Strength & Muscle Tone: within normal limits Gait & Station: normal Patient leans: Right  Psychiatric Specialty Exam: Physical Exam  Nursing note and vitals reviewed.   Review of Systems  Gastrointestinal: Positive for nausea.  All other systems reviewed and are negative.   Blood pressure 138/92, pulse 86, temperature 98.5 F (36.9 C), temperature source Oral, resp. rate 20, height 5\' 7"  (1.702 m), weight 57.153 kg (126 lb), SpO2 98 %.Body mass index is 19.73 kg/(m^2).  See SRA.                                                     Have you used any form of tobacco in the last 30 days? (Cigarettes, Smokeless Tobacco, Cigars, and/or Pipes): Yes  Has this patient used any form of tobacco in the last 30 days? (Cigarettes, Smokeless Tobacco, Cigars, and/or Pipes) Yes, A prescription for an FDA-approved tobacco cessation medication was offered at discharge and the patient refused  Mental Status Per Nursing Assessment::   On Admission:     Current Mental Status by Physician: NA  Loss Factors: Decrease in vocational status, Loss of significant relationship and Financial problems/change in socioeconomic status  Historical Factors: Impulsivity  Risk Reduction Factors:   Responsible for children under 24 years of age, Sense of responsibility to family, Living with another person, especially a relative and Positive social support  Continued Clinical Symptoms:  Depression:   Impulsivity  Cognitive Features That Contribute To Risk:  None    Suicide Risk:  Minimal: No identifiable suicidal ideation.  Patients presenting with no risk factors but with morbid ruminations; may be classified as minimal risk based on the severity of the depressive symptoms  Principal Problem: Adjustment  disorder with depressed mood Discharge Diagnoses:  Patient Active Problem List   Diagnosis Date Noted  . Intentional drug overdose (HCC) [T50.902A] 02/04/2015  . Tobacco use disorder [F17.200] 02/04/2015  . Abdominal pain [R10.9] 02/04/2015  . Adjustment disorder with depressed mood [F43.21]       Plan Of Care/Follow-up recommendations:  Activity:  As tolerated. Diet:  Regular. Other:  He'll follow-up appointments.  Is patient on multiple antipsychotic therapies at discharge:  No   Has Patient had three or more failed trials of antipsychotic monotherapy by history:  No  Recommended Plan for Multiple Antipsychotic Therapies: NA    Spencer Hale 02/06/2015, 10:11 AM

## 2015-02-06 NOTE — Progress Notes (Signed)
BP=138/92, HR=86 manually on re-assessment; Asymptomatic.

## 2015-02-06 NOTE — Progress Notes (Signed)
Patient denies SI/HI, denies A/V hallucinations. Patient verbalizes understanding of discharge instructions, follow up care. Patient given all belongings from  locker. Patient escorted out by staff, transported by the  cab to the bus stop.

## 2015-02-06 NOTE — Plan of Care (Signed)
Problem: Ineffective individual coping Goal: LTG: Patient will report a decrease in negative feelings Outcome: Progressing Patient denied SI and consented verbally to come to staffs if there is any intrusive or negative feelings, will monitor.

## 2015-02-06 NOTE — Discharge Summary (Signed)
Physician Discharge Summary Note  Patient:  Spencer Hale is an 24 y.o., male MRN:  [id number] DOB:  Sep 06, 1990 Patient phone:  [phone] (home)  Patient address:   [address] [place] [address],  Total Time spent with patient: 1 hour  Date of Admission:  02/05/2015 Date of Discharge: 02/06/2015  Reason for Admission:  Suicide attempt.  Identifying data. Mr. Spencer Hale is a 24 year old male with no prior psychiatric history.   Chief complaint. "This was stupid."   History of present illness. Information was obtained from the patient and the chart. The patient does not have history of mental illness however for the past past 2 months he's seen increasingly stressed out after he lost his job of 8 months. He was unable to pay his child support and had to live off his girlfriend. On the night of admission he was arguing with his girlfriend then they decided to get themselves together. He overdosed on medications prescribed for the girlfriend. Somehow they called the ambulance and were both thought to the emergency room of [facility]. The girlfriend was admitted there. The patient was brought to [facility]. He now feels ashamed of his action. He was able to talk to his mother and is moving in with her. He spoke with her mother of his 50-year-old son to learn that she will allow him to see his child now. He also wants to break up with a girlfriend feeling that she has been toxic. He denies any symptoms of depression, anxiety, or psychosis. He denies symptoms suggestive of bipolar mania. He denies any thoughts of hurting himself or others. He is a cigarette smoker but does not use any drugs or alcohol.  Past psychiatric history. He was diagnosed with ADHD in his childhood and was taking Ritalin with success. This stopped when he lost his Medicaid. He never attempted suicide and has never been hospitalized. No medication trials other than ADD  medication.   Family psychiatric history. Mother with depression and heroine addiction. She attempted suicide not long ago. Sister with bipolar.   Social history. He graduated from high school and almost completed Orthoptist courses at the [employer]. He had to quit school and as he needed to get a job to pay his child support. 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 He has a 51-year-old son. He used to live with a girlfriend but this no longer is a plan. He will move in with his mother.   Principal Problem: Adjustment disorder with depressed mood Discharge Diagnoses: Patient Active Problem List   Diagnosis Date Noted  . Intentional drug overdose (HCC) [T50.902A] 02/04/2015  . Tobacco use disorder [F17.200] 02/04/2015  . Abdominal pain [R10.9] 02/04/2015  . Adjustment disorder with depressed mood [F43.21]     Past Psychiatric History: None reported.  Past Medical History:  Past Medical History  Diagnosis Date  . Depression     Past Surgical History  Procedure Laterality Date  . No past surgeries     Family History: History reviewed. No pertinent family history. Family Psychiatric  History: Mother with depression and sister with bipolar. Social History:  History  Alcohol Use  . Yes    Comment: [employer] lemonade on weekends     History  Drug Use No    Social History   Social History  . Marital Status: Single  Spouse Name: N/A  . Number of Children: N/A  . Years of Education: N/A   Social History Main Topics  . Smoking status: Current Every Day Smoker    Types: Cigarettes  . Smokeless tobacco: None  . Alcohol Use: Yes     Comment: [employer] lemonade on weekends  . Drug Use: No  . Sexual Activity: Not Asked   Other Topics Concern  . None   Social History Narrative    Hospital Course:    Mr. Spencer Hale is a 24 year old male with no past psychiatric history [facility] attempt by medication overdose.  1. Suicidal ideation. This has resolved. The patient adamantly denies any thoughts intentions or plans to hurt himself or others. He is able to contract for safety.  2. Mood. The patient denies any history of depression. He started feeling about 2 months ago when he lost his job. He is not interested in pharmacotherapy. He will follow up with a therapist  3. Smoking. Nicotine patch was available.  4. Status post overdose. He experienced some nausea this has resolved now.  5. Disposition. He was discharged to home with his mother. [facility]    Physical Findings: AIMS: Facial and Oral Movements Muscles of Facial Expression: None, normal Lips and Perioral Area: None, normal Jaw: None, normal Tongue: None, normal,Extremity Movements Upper (arms, wrists, hands, fingers): None, normal Lower (legs, knees, ankles, toes): None, normal, Trunk Movements Neck, shoulders, hips: None, normal, Overall Severity Severity of abnormal movements (highest score from questions above): None, normal Incapacitation due to abnormal movements: None, normal Patient's awareness of abnormal movements (rate only patient's report): No Awareness, Dental Status Current problems with teeth and/or dentures?: No Does patient usually wear dentures?: No  CIWA:  CIWA-Ar Total: 0 COWS:  COWS Total Score: 0  Musculoskeletal: Strength & Muscle Tone: within normal limits Gait & Station: normal Patient leans: N/A  Psychiatric Specialty Exam: Review of Systems  Gastrointestinal: Positive for nausea.  All other systems reviewed and are negative.   Blood pressure 138/92, pulse 86, temperature 98.5 F (36.9 C), temperature source Oral, resp. rate 20, height 5\' 7"  (1.702 m), weight 57.153 kg (126 lb), SpO2 98 %.Body mass index is 19.73 kg/(m^2).  See SRA.                                                  Sleep:  Number of  Hours: 7   Have you used any form of tobacco in the last 30 days? (Cigarettes, Smokeless Tobacco, Cigars, and/or Pipes): Yes  Has this patient used any form of tobacco in the last 30 days? (Cigarettes, Smokeless Tobacco, Cigars, and/or Pipes) Yes, Yes, A prescription for an FDA-approved tobacco cessation medication was offered at discharge and the patient refused  Metabolic Disorder Labs:  No results found for: HGBA1C, MPG No results found for: PROLACTIN No results found for: CHOL, TRIG, HDL, CHOLHDL, VLDL, LDLCALC  See Psychiatric Specialty Exam and Suicide Risk Assessment completed by Attending Physician prior to discharge.  Discharge destination:  Home  Is patient on multiple antipsychotic therapies at discharge:  No   Has Patient had three or more failed trials of antipsychotic monotherapy by history:  No  Recommended Plan for Multiple Antipsychotic Therapies: NA  Discharge Instructions    Diet - low sodium heart  healthy    Complete by:  As directed      Increase activity slowly    Complete by:  As directed             Medication List    TAKE these medications      Indication   ibuprofen 200 MG tablet  Commonly known as:  ADVIL,MOTRIN  Take 200 mg by mouth every 6 (six) hours as needed for mild pain.          Follow-up recommendations:  Activity:  As tolerated. Diet:  Regular. Other:  Keep follow-up appointments.  Comments:    Signed: Jamin Panther 02/06/2015, 10:13 AM

## 2015-02-06 NOTE — [facility] Suicide Risk Assessment (Signed)
Spencer Hale Admission Suicide Risk Assessment   Nursing information obtained from:    Demographic factors:    Current Mental Status:    Loss Factors:    Historical Factors:    Risk Reduction Factors:    Total Time spent with patient: 1 hour Principal Problem: Major depressive disorder, single episode, severe without psychotic features (HCC) Diagnosis:   Patient Active Problem List   Diagnosis Date Noted  . Intentional drug overdose (HCC) [T50.902A] 02/04/2015  . Tobacco use disorder [F17.200] 02/04/2015  . Abdominal pain [R10.9] 02/04/2015  . Major depressive disorder, single episode, severe without psychotic features (HCC) [F32.2]      Continued Clinical Symptoms:  Alcohol Use Disorder Identification Test Final Score (AUDIT): 2 The "Alcohol Use Disorders Identification Test", Guidelines for Use in Primary Care, Second Edition.  World Science writer [facility]). Score between 0-7:  no or low risk or alcohol related problems. Score between 8-15:  moderate risk of alcohol related problems. Score between 16-19:  high risk of alcohol related problems. Score 20 or above:  warrants further diagnostic evaluation for alcohol dependence and treatment.   CLINICAL FACTORS:   Depression:   Impulsivity   Musculoskeletal: Strength & Muscle Tone: within normal limits Gait & Station: normal Patient leans: N/A  Psychiatric Specialty Exam: Physical Exam  Nursing note and vitals reviewed. Constitutional: He is oriented to person, place, and time. He appears well-developed and well-nourished.  HENT:  Head: Normocephalic and atraumatic.  Eyes: Conjunctivae and EOM are normal. Pupils are equal, round, and reactive to light.  Neck: Normal range of motion. Neck supple.  Cardiovascular: Normal rate, regular rhythm and normal heart sounds.   Respiratory: Effort normal and breath sounds normal.  GI: Soft. Bowel sounds are normal.  Musculoskeletal: Normal range of motion.  Neurological: He is alert and  oriented to person, place, and time.  Skin: Skin is warm and dry.    Review of Systems  Gastrointestinal: Positive for nausea.  All other systems reviewed and are negative.   Blood pressure 138/92, pulse 86, temperature 98.5 F (36.9 C), temperature source Oral, resp. rate 20, height  (1.702 m), weight 57.153 kg (126 lb), SpO2 98 %.Body mass index is 19.73 kg/(m^2).  General Appearance: Casual  Eye Contact::  Good  Speech:  Clear and Coherent  Volume:  Normal  Mood:  Euthymic  Affect:  Appropriate  Thought Process:  Logical  Orientation:  Full (Time, Place, and Person)  Thought Content:  WDL  Suicidal Thoughts:  No  Homicidal Thoughts:  No  Memory:  Immediate;   Fair Recent;   Fair Remote;   Fair  Judgement:  Fair  Insight:  Present  Psychomotor Activity:  Normal  Concentration:  Fair  Recall:  [employer] of Knowledge:Fair  Language: Fair  Akathisia:  No  Handed:  Right  AIMS (if indicated):     Assets:  Communication Skills Desire for Improvement Housing Physical Health Resilience Social Support Transportation  Sleep:  Number of Hours: 7  Cognition: WNL  ADL's:  Intact     COGNITIVE FEATURES THAT CONTRIBUTE TO RISK:  None    SUICIDE RISK:   Minimal: No identifiable suicidal ideation.  Patients presenting with no risk factors but with morbid ruminations; may be classified as minimal risk based on the severity of the depressive symptoms  PLAN OF CARE: Hospital admission, medication management, discharge planning.  Medical Decision Making:  New problem, with additional work up planned, Review of Psycho-Social Stressors (1), Review or order  clinical lab tests (1), Review of Medication Regimen & Side Effects (2) and Review of New Medication or Change in Dosage (2)   Spencer Hale is a 24 year old male with no past psychiatric history [facility] suicide [facility] by medication overdose.  1. Suicidal ideation. This has resolved. The patient  adamantly denies any thoughts intentions or plans to hurt himself or others. He is able to contract for safety.  2. Mood. The patient denies any history of depression. He started feeling about 2 months ago when he lost his job. He is not interested in pharmacotherapy. He will follow up with a therapist  3. Smoking. Nicotine patch was available.  4. Status post overdose. He experienced some nausea this has resolved now.  5 disposition. He was discharged to home with his mother. [facility]   I certify that inpatient services furnished can reasonably be expected to improve the patient's condition.   Spencer Hale 02/06/2015, 9:30 AM

## 2016-09-08 IMAGING — DX DG ABDOMEN 2V
2 series · 2 of 2 positions shown · non-contrast
Comparison: None.

CLINICAL DATA: Vomiting.

EXAM:
ABDOMEN - 2 VIEW

[x abdomen supine]
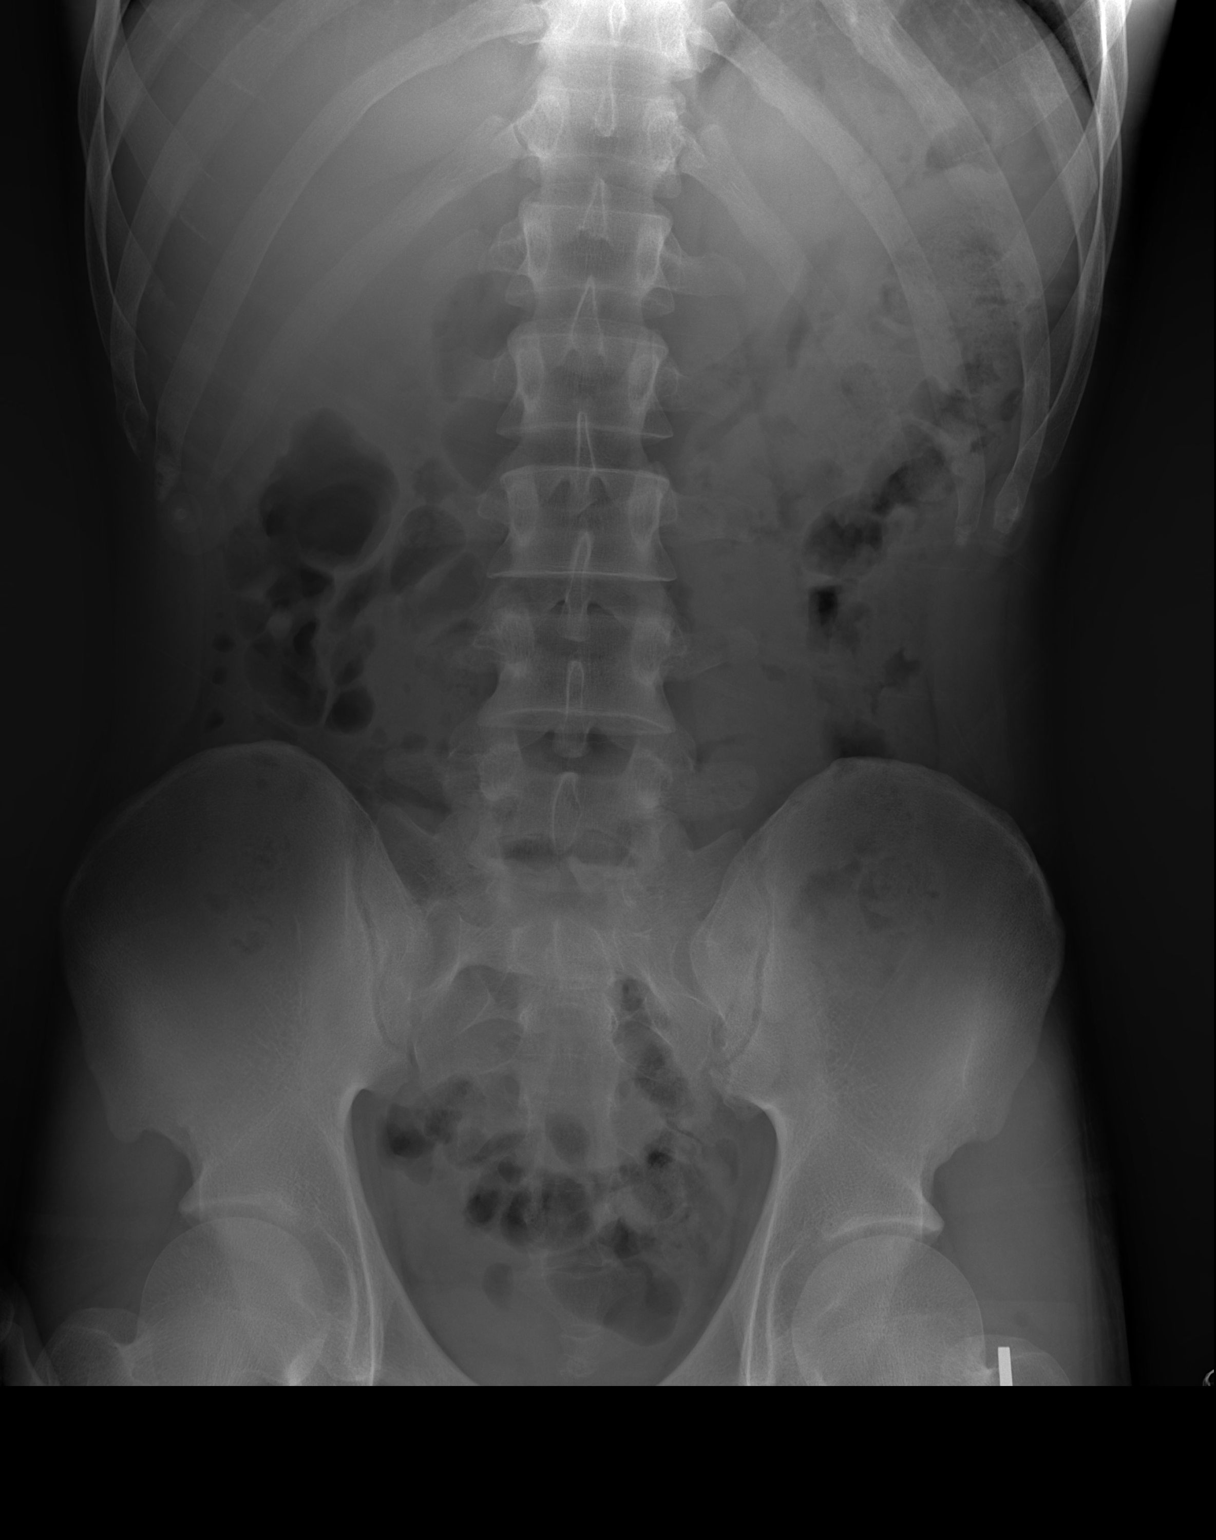

[x abdomen decub]
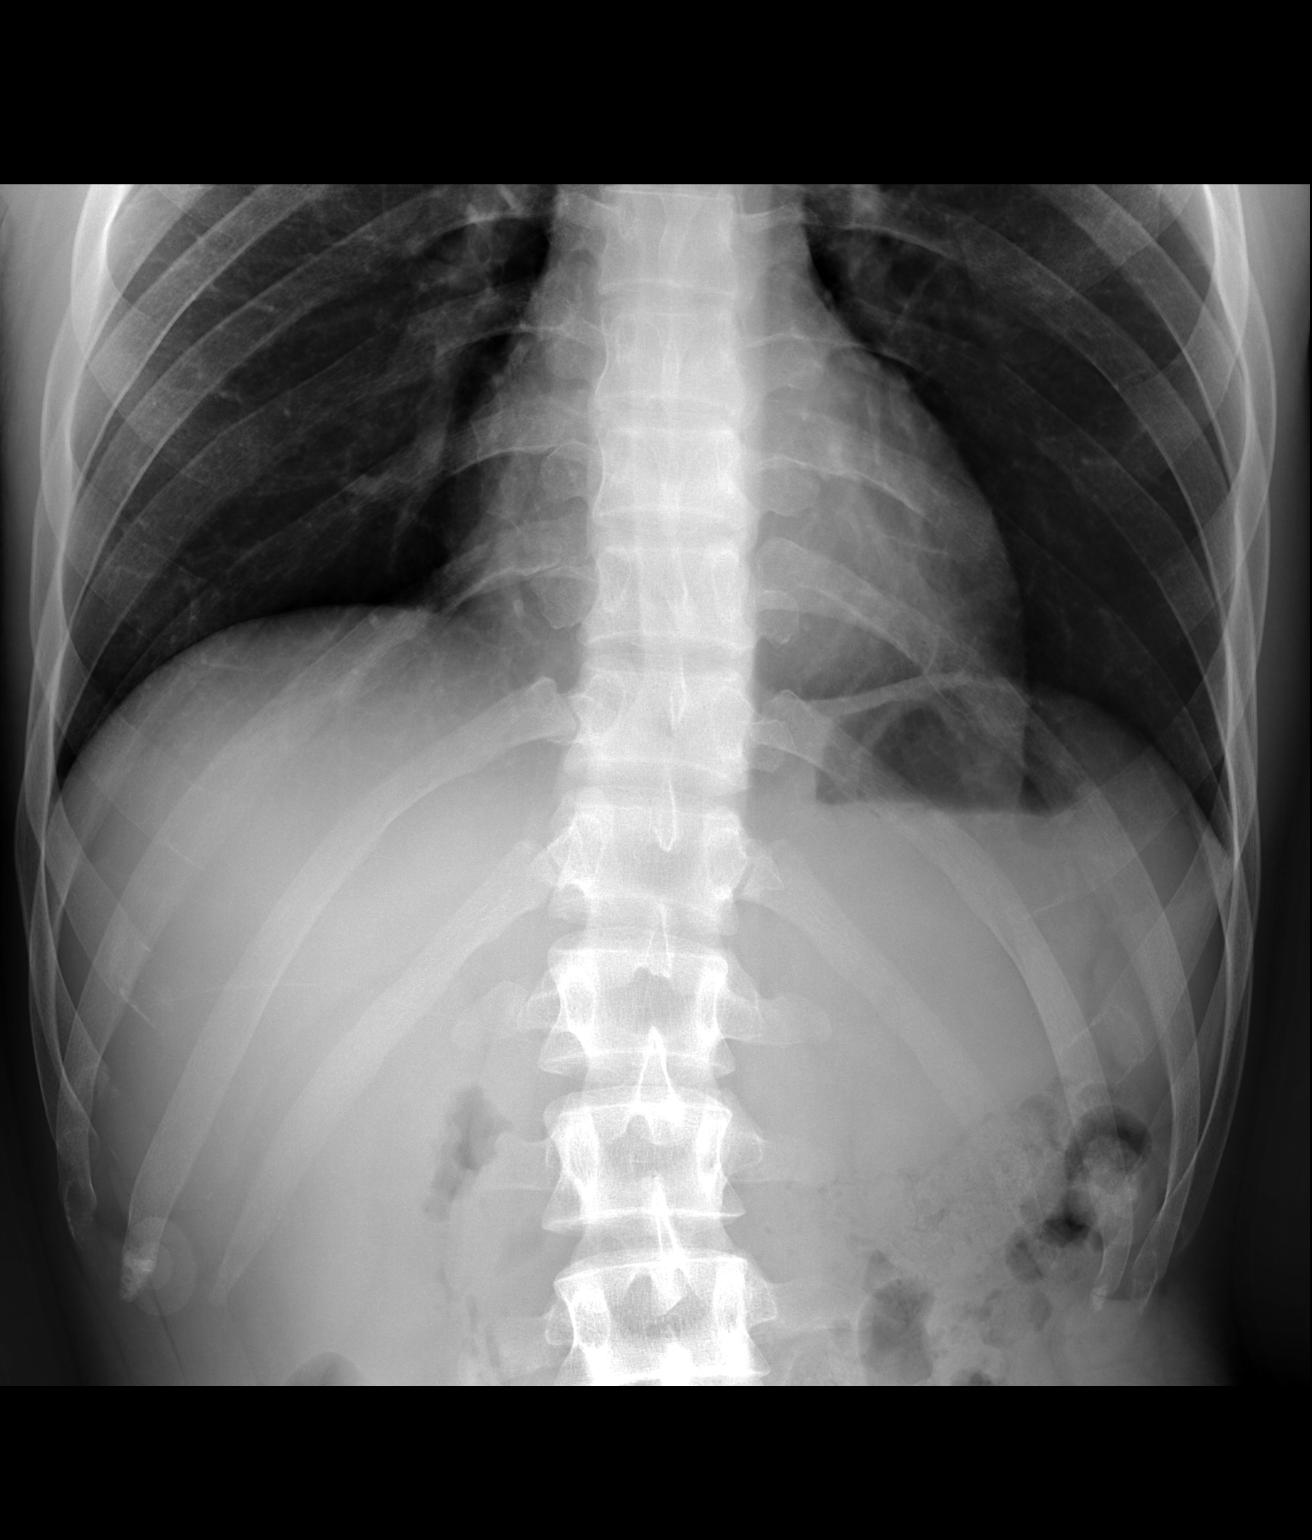

[2 of 2 positions shown; findings below may reference images not displayed]

FINDINGS: Supine and upright views of the abdomen are provided. Overall bowel
gas pattern is nonobstructive. No evidence of soft tissue mass or
abnormal fluid collection seen. No evidence of free intraperitoneal
air seen. No pathologic - appearing calcifications. No osseous
abnormality. Lung bases are clear.
IMPRESSION: Nonobstructive bowel gas pattern and no evidence of acute
intra-abdominal abnormality.

## 2020-01-30 ENCOUNTER — Telehealth (INDEPENDENT_AMBULATORY_CARE_PROVIDER_SITE_OTHER): Payer: No Payment, Other | Admitting: Psychiatry

## 2020-01-30 ENCOUNTER — Other Ambulatory Visit: Payer: Self-pay

## 2020-01-30 ENCOUNTER — Encounter (HOSPITAL_COMMUNITY): Payer: Self-pay | Admitting: Psychiatry

## 2020-01-30 DIAGNOSIS — F17200 Nicotine dependence, unspecified, uncomplicated: Secondary | ICD-10-CM | POA: Diagnosis not present

## 2020-01-30 DIAGNOSIS — F332 Major depressive disorder, recurrent severe without psychotic features: Secondary | ICD-10-CM | POA: Diagnosis not present

## 2020-01-30 DIAGNOSIS — F900 Attention-deficit hyperactivity disorder, predominantly inattentive type: Secondary | ICD-10-CM | POA: Diagnosis not present

## 2020-01-30 DIAGNOSIS — F411 Generalized anxiety disorder: Secondary | ICD-10-CM | POA: Diagnosis not present

## 2020-01-30 MED ORDER — HYDROXYZINE HCL 10 MG PO TABS: 10.0000 mg | ORAL_TABLET | Freq: Three times a day (TID) | ORAL | 2 refills | Status: DC | PRN

## 2020-01-30 MED ORDER — ATOMOXETINE HCL 40 MG PO CAPS: 40.0000 mg | ORAL_CAPSULE | Freq: Every day | ORAL | 2 refills | Status: DC

## 2020-01-30 MED ORDER — QUETIAPINE FUMARATE 50 MG PO TABS: 50.0000 mg | ORAL_TABLET | Freq: Every day | ORAL | 2 refills | Status: DC
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
## 2020-02-23 ENCOUNTER — Encounter (HOSPITAL_COMMUNITY): Payer: Self-pay | Admitting: Psychiatry

## 2020-02-23 ENCOUNTER — Telehealth (INDEPENDENT_AMBULATORY_CARE_PROVIDER_SITE_OTHER): Payer: No Payment, Other | Admitting: Psychiatry

## 2020-02-23 ENCOUNTER — Telehealth (HOSPITAL_COMMUNITY): Payer: Self-pay | Admitting: *Deleted

## 2020-02-23 ENCOUNTER — Other Ambulatory Visit: Payer: Self-pay

## 2020-02-23 DIAGNOSIS — F900 Attention-deficit hyperactivity disorder, predominantly inattentive type: Secondary | ICD-10-CM

## 2020-02-23 DIAGNOSIS — F411 Generalized anxiety disorder: Secondary | ICD-10-CM

## 2020-02-23 MED ORDER — HYDROXYZINE HCL 10 MG PO TABS
10.0000 mg | ORAL_TABLET | Freq: Three times a day (TID) | ORAL | 2 refills | Status: DC | PRN
Start: 2020-02-23 — End: 2020-05-23

## 2020-02-23 MED ORDER — ATOMOXETINE HCL 80 MG PO CAPS: 80.0000 mg | ORAL_CAPSULE | Freq: Every day | ORAL | 2 refills | Status: DC

## 2020-02-23 NOTE — Telephone Encounter (Signed)
Call from [facility] to asking for a letter reflecting the change Ms Spencer Hale made today in his Seroquel as he is currently living at a treatment center. She was able to take a verbal from Clinical research associate re the sig on his Seroquel as 25 mg a HS for one week and the discontinue taking it.

## 2020-02-23 NOTE — Progress Notes (Signed)
BH MD/PA/NP OP Progress Note Virtual Visit via Telephone Note  I connected with Spencer Hale on 02/23/20 at  1:00 PM EST by telephone and verified that I am speaking with the correct person using two identifiers.  Location: Patient: home Provider: Clinic   I discussed the limitations, risks, security and privacy concerns of performing an evaluation and management service by telephone and the availability of in person appointments. I also discussed with the patient that there may be a patient responsible charge related to this service. The patient expressed understanding and agreed to proceed.   I provided 30 minutes of non-face-to-face time during this encounter.   02/23/2020 3:30 PM Spencer Hale  MRN:  [id number]  Chief Complaint:  "I feel too drowsy in the morning"  HPI: 29 year old male patient presents today for follow-up psychiatric evaluation.  He has a psychiatric history of ADHD, adjustment disorder, anxiety, depression, substance-induced mood disorder, polysubstance abuse (in remission), and SI.  He is currently managed on Hydroxyzine 10 mg three times a day, Strattera 40 mg daily, and Seroquel 50 mg at bedtime.  He reports that he has noticed a significant improvement in his symptoms and feels as though he is more easy going now.  He however, he reports that he is too drowsy in the morning and and he has headaches after taking Seroquel.   Patient was unable to connect to the video link today and this visit was conducted via telephone.  Today he is well engaged in conversation, cooperative, and pleasant.  Today he describes his mood to be stable and easy going.  He also reports that his focus and concentration has been improved with Blase Mess but it could be better.  He denies any recent anxiety, depressive, manic, or hypomanic symptoms.     Patient reports that he has been able to maintain his sobriety for the past 50 days.  He denies any AVH or paranoia since he has stopped using  illicit substances.  He reports that his mood has been stable since starting on his current medication regimen.  He also denies any recent fluctuations in his mood, racing thoughts, irritability, delusions, poor sleep, or distractibility.  Patient endorses sleeping about 8 hours per night.  Patient is currently living at [facility] and he plans to transition to another program at [facility] as soon as possible.  He reports that he looks forward to starting the program at [facility] because it will help to transition back into the world.  He however, he reports that he cannot be prescribed Seroquel while at [facility].  He would like to know if his Seroquel can be discontinued today.   Patient is agreeable to discontinue Seroquel at this time due to excessive drowsiness, headaches, and [facility] medication restrictions.  Provider offered to start low-dose Abilify to prevent AVH reoccurrence and maintain mood stability.  However, the patient reports that he believes that his previous hallucinations were caused by his drug use.  Patient declines additional medication at this time.  Patient reports that his focus and concentration has been improved with Blase Mess but could be better.  Patient is agreeable to increase Straterra to 80 mg daily.  No other concerns at this time.      Visit Diagnosis:    ICD-10-CM   1. Attention deficit hyperactivity disorder (ADHD), predominantly inattentive type  F90.0 atomoxetine (STRATTERA) 80 MG capsule  2. Generalized anxiety disorder  F41.1 hydrOXYzine (ATARAX/VISTARIL) 10 MG tablet  Past Medical History: Adjustment disorder, induced psychotic disorder, ADHD, depression, anxiety, intentional drug overdose, substance induced delirium, SI/SA Past Medical History:  Diagnosis Date  . Depression     Past Surgical History:  Procedure Laterality Date  . NO PAST SURGERIES      Family Psychiatric History: Mother depression    Family History: No family history on file.  Social History:  Social History   Socioeconomic History  . Marital status: Single    Spouse name: Not on file  . Number of children: Not on file  . Years of education: Not on file  . Highest education level: Not on file  Occupational History  . Not on file  Tobacco Use  . Smoking status: Current Every Day Smoker    Types: Cigarettes  Substance and Sexual Activity  . Alcohol use: Yes    Comment: [employer] hard lemonade on weekends  . Drug use: No  . Sexual activity: Not on file  Other Topics Concern  . Not on file  Social History Narrative  . Not on file   Social Determinants of Health   Financial Resource Strain:   . Difficulty of Paying Living Expenses: Not on file  Food Insecurity:   . Worried About Programme researcher, broadcasting/film/video in the Last Year: Not on file  . Ran Out of Food in the Last Year: Not on file  Transportation Needs:   . Lack of Transportation (Medical): Not on file  . Lack of Transportation (Non-Medical): Not on file  Physical Activity:   . Days of Exercise per Week: Not on file  . Minutes of Exercise per Session: Not on file  Stress:   . Feeling of Stress : Not on file  Social Connections:   . Frequency of Communication with Friends and Family: Not on file  . Frequency of Social Gatherings with Friends and Family: Not on file  . Attends Religious Services: Not on file  . Active Member of Clubs or Organizations: Not on file  . Attends Banker Meetings: Not on file  . Marital Status: Not on file    Allergies: No Known Allergies  Metabolic Disorder Labs: No results found for: HGBA1C, MPG No results found for: PROLACTIN No results found for: CHOL, TRIG, HDL, CHOLHDL, VLDL, LDLCALC Lab Results  Component Value Date   TSH 1.377 02/05/2015    Therapeutic Level Labs: No results found for: LITHIUM No results found for: VALPROATE No components found for:  CBMZ  Current Medications: Current  Outpatient Medications  Medication Sig Dispense Refill  . atomoxetine (STRATTERA) 80 MG capsule Take 1 capsule (80 mg total) by mouth daily. 30 capsule 2  . hydrOXYzine (ATARAX/VISTARIL) 10 MG tablet Take 1 tablet (10 mg total) by mouth 3 (three) times daily as needed. 90 tablet 2  . ibuprofen (ADVIL,MOTRIN) 200 MG tablet Take 200 mg by mouth every 6 (six) hours as needed for mild pain.     No current facility-administered medications for this visit.     Musculoskeletal: Strength & Muscle Tone: Unable to assess due to telephone visit Gait & Station: Unable to assess due to telephone visit Patient leans: N/A  Psychiatric Specialty Exam: Review of Systems  There were no vitals taken for this visit.There is no height or weight on file to calculate BMI.  General Appearance: Unable to assess due to telephone visit  Eye Contact:  Unable to assess due to telephone visit  Speech:  Clear and Coherent and Normal Rate  Volume:  Normal  Mood:  Euthymic  Affect:  Appropriate and Congruent  Thought Process:  Coherent, Goal Directed and Linear  Orientation:  Full (Time, Place, and Person)  Thought Content: WDL and Logical   Suicidal Thoughts:  No  Homicidal Thoughts:  No  Memory:  Immediate;   Good Recent;   Good Remote;   Good  Judgement:  Good  Insight:  Good  Psychomotor Activity:  Normal  Concentration:  Concentration: Good and Attention Span: Good  Recall:  Good  Fund of Knowledge: Good  Language: Good  Akathisia:  No  Handed:  Right  AIMS (if indicated):Not done  Assets:  Communication Skills Desire for Improvement Financial Resources/Insurance Housing Social Support  ADL's:  Intact  Cognition: WNL  Sleep:  Good   Screenings: AIMS     Admission (Discharged) from 02/05/2015 in [facility] INPATIENT BEHAVIORAL MEDICINE  AIMS Total Score 0    AUDIT     Admission (Discharged) from 02/05/2015 in [facility] INPATIENT BEHAVIORAL MEDICINE  Alcohol Use Disorder Identification Test Final  Score (AUDIT) 2    GAD-7     Video Visit from 01/30/2020 in [facility]  Total GAD-7 Score 18    PHQ2-9     Video Visit from 01/30/2020 in [facility]  PHQ-2 Total Score 5  PHQ-9 Total Score 26       Assessment and Plan:  Patient reports that he has noticed a significant improvement in his mood, anxiety, concentration, and focus since his last visit. He notes that Seroquel makes him too drowsy in the morning, he has headaches, and the recovery program at [facility] restricts the use of this medication. Patient is agreeable to discontinue Seroquel at this time.  Patient instructed to Seroquel 50 mg in half for 1 week and then discontinue.  Patient denies any AVH since his last visit.  Provider offered to start low-dose Abilify to prevent AVH reoccurrence and maintain mood stability.  Patient declines additional medication at this time and he will contact the provider if he experiences any AVH.  Patient reports that his focus and concentration has been improved with Blase Mess but could be better.  Patient is agreeable to increase Straterra to 80 mg daily.  Patient will continue all other medications as prescribed.  No other concerns at this time.   1. Attention deficit hyperactivity disorder (ADHD), predominantly inattentive type  Increased- atomoxetine (STRATTERA) 80 MG capsule; Take 1 capsule (80 mg total) by mouth daily.  Dispense: 30 capsule; Refill: 2  2. Generalized anxiety disorder  Continue- hydrOXYzine (ATARAX/VISTARIL) 10 MG tablet; Take 1 tablet (10 mg total) by mouth 3 (three) times daily as needed.  Dispense: 90 tablet; Refill: 2  Follow up in 3 months  Shanna Cisco, NP 02/23/2020, 3:30 PM

## 2020-03-12 ENCOUNTER — Telehealth (HOSPITAL_COMMUNITY): Payer: No Typology Code available for payment source | Admitting: Psychiatry

## 2020-04-18 ENCOUNTER — Ambulatory Visit: Payer: Self-pay | Admitting: Adult Health

## 2020-04-25 ENCOUNTER — Ambulatory Visit: Payer: Self-pay | Admitting: Gerontology

## 2020-04-25 ENCOUNTER — Other Ambulatory Visit: Payer: Self-pay

## 2020-04-25 ENCOUNTER — Encounter: Payer: Self-pay | Admitting: Gerontology

## 2020-04-25 VITALS — BP 116/78 | HR 75 | Temp 98.1°F | Resp 16 | Wt 172.5 lb

## 2020-04-25 DIAGNOSIS — Z7689 Persons encountering health services in other specified circumstances: Secondary | ICD-10-CM

## 2020-04-25 DIAGNOSIS — K0381 Cracked tooth: Secondary | ICD-10-CM

## 2020-04-25 DIAGNOSIS — H538 Other visual disturbances: Secondary | ICD-10-CM

## 2020-04-25 NOTE — Progress Notes (Signed)
New Patient Office Visit  Subjective:  Patient ID: Spencer Hale, male    DOB: 1991/01/04  Age: 30 y.o. MRN: [id number]  CC: No chief complaint on file.   HPI Spencer Hale presents to establish care and evaluation of his chronic conditions. He states that he has a history of depression and has counseling sesions at [facility]. He states that his mood is good, denies suicidal nor homicidal ideation. He c/o multiple cracked teeth, to his left lower and right upper teeth, he denies pain and swelling, and has not been to the Dentist in many years. He also c/o of blurry vision, but denies pain and discharge. Overall, he states that he's doing well and offers no further complaint.    Past Medical History:  Diagnosis Date  . Depression     Past Surgical History:  Procedure Laterality Date  . NO PAST SURGERIES      No family history on file.  Social History   Socioeconomic History  . Marital status: Single    Spouse name: Not on file  . Number of children: Not on file  . Years of education: Not on file  . Highest education level: Not on file  Occupational History  . Not on file  Tobacco Use  . Smoking status: Current Every Day Smoker  . Smokeless tobacco: Current User  Substance and Sexual Activity  . Alcohol use: Yes    Comment: [employer] hard lemonade on weekends  . Drug use: No  . Sexual activity: Not on file  Other Topics Concern  . Not on file  Social History Narrative  . Not on file   Social Determinants of Health   Financial Resource Strain: Not on file  Food Insecurity: Not on file  Transportation Needs: Not on file  Physical Activity: Not on file  Stress: Not on file  Social Connections: Not on file  Intimate Partner Violence: Not on file    ROS Review of Systems  Constitutional: Negative.   HENT: Positive for dental problem.   Eyes: Positive for visual disturbance (blurry vision).  Respiratory: Negative.   Cardiovascular: Negative.   Gastrointestinal:  Negative.   Endocrine: Negative.   Genitourinary: Negative.   Musculoskeletal: Negative.   Skin: Negative.   Neurological: Negative.   Hematological: Negative.   Psychiatric/Behavioral: Negative.     Objective:   Today's Vitals: BP 116/78 (BP Location: Right Arm, Patient Position: Sitting, Cuff Size: Large)   Pulse 75   Temp 98.1 F (36.7 C)   Resp 16   Wt 172 lb 8 oz (78.2 kg)   SpO2 97%   BMI 27.02 kg/m   Physical Exam HENT:     Head: Normocephalic and atraumatic.     Nose: Nose normal.     Comments: Deferred per Covid protocol    Mouth/Throat:     Mouth: Mucous membranes are moist.   Eyes:     Extraocular Movements: Extraocular movements intact.     Conjunctiva/sclera: Conjunctivae normal.     Pupils: Pupils are equal, round, and reactive to light.  Cardiovascular:     Rate and Rhythm: Normal rate and regular rhythm.     Pulses: Normal pulses.     Heart sounds: Normal heart sounds.  Pulmonary:     Effort: Pulmonary effort is normal.     Breath sounds: Normal breath sounds.  Abdominal:     General: Abdomen is flat. Bowel sounds are normal.     Palpations: Abdomen is soft.  Genitourinary:  Comments: Deferred per patient Musculoskeletal:        General: Normal range of motion.     Cervical back: Normal range of motion.  Skin:    General: Skin is warm and dry.  Neurological:     General: No focal deficit present.     Mental Status: He is alert and oriented to person, place, and time. Mental status is at baseline.  Psychiatric:        Mood and Affect: Mood normal.        Behavior: Behavior normal.        Thought Content: Thought content normal.        Judgment: Judgment normal.     Assessment & Plan:     1. Encounter to establish care -Routine labs will be checked - CBC w/Diff; Future - Lipid panel; Future - Comp Met (CMET); Future - Urinalysis; Future  2. Blurry vision, bilateral - He will follow up with [facility] Ophthalmologist. - Ambulatory  referral to Ophthalmology  3. Broken or cracked tooth, nontraumatic - He was provided with Dental application and Dental referral was ordered.   Follow-up: Return in about 1 month (around 05/23/2020), or if symptoms worsen or fail to improve.   Eyva Califano Jerold Coombe, NP

## 2020-05-23 ENCOUNTER — Telehealth (INDEPENDENT_AMBULATORY_CARE_PROVIDER_SITE_OTHER): Payer: No Payment, Other | Admitting: Psychiatry

## 2020-05-23 ENCOUNTER — Other Ambulatory Visit: Payer: Self-pay

## 2020-05-23 ENCOUNTER — Encounter (HOSPITAL_COMMUNITY): Payer: Self-pay | Admitting: Psychiatry

## 2020-05-23 DIAGNOSIS — F3341 Major depressive disorder, recurrent, in partial remission: Secondary | ICD-10-CM

## 2020-05-23 DIAGNOSIS — F1511 Other stimulant abuse, in remission: Secondary | ICD-10-CM | POA: Diagnosis not present

## 2020-05-23 DIAGNOSIS — Z7689 Persons encountering health services in other specified circumstances: Secondary | ICD-10-CM

## 2020-05-23 NOTE — Progress Notes (Signed)
BH MD/PA/NP OP Progress Note Virtual Visit via Telephone Note  I connected with Spencer Hale on 05/23/20 at  2:30 PM EST by telephone and verified that I am speaking with the correct person using two identifiers.  Location: Patient: home Provider: Clinic   I discussed the limitations, risks, security and privacy concerns of performing an evaluation and management service by telephone and the availability of in person appointments. I also discussed with the patient that there may be a patient responsible charge related to this service. The patient expressed understanding and agreed to proceed.   I provided 30 minutes of non-face-to-face time during this encounter.   05/23/2020 3:05 PM Spencer Hale  MRN:  [id number]  Chief Complaint:  "Things are going well "  HPI: 30 year old male seen today for follow-up psychiatric evaluation.  He has a psychiatric history of ADHD, adjustment disorder, anxiety, depression, substance-induced mood disorder, polysubstance abuse (in remission), and SI.  He is currently managed on Hydroxyzine 10 mg three times a day and Strattera 80 mg daily.  He notes that he is not allowed to take hydroxyzine at [facility] recovery center.  He also informed by that he would like to discontinue Strattera.  Today he was unable to login virtually so his assessment was done on the phone.  During exam he was engaged in conversation, cooperative, and pleasant.  He informed provider that he is still in recovery and notes that he feels really good.  He informed provider that his mood is stable and denies anxiety or depression.  Provider conducted a GAD-7 and a PHQ-9 and patient scored a 0 on both.  He endorses adequate sleep.  He notes that since being in [facility] he has gained weight.  He notes that when he first went in he was 115 and now he weighs 174.  Patient also endorses adequate appetite.  Today he denies SI/HI/VAH or paranoia.    Patient reports that he has been able to maintain  his sobriety since November.  He notes that he is hopeful for his future.    At this time patient notes that he does not want to continue taking Strattera and is agreeable to discontinue it.  Patient requests that a letter be sent over to [facility] confirming this.  Provider wrote a letter and will fax it over to [facility].  No other concerns noted at this time.    Visit Diagnosis:    ICD-10-CM   1. Recurrent major depressive disorder, in partial remission (HCC)  F33.41   2. Methamphetamine use disorder, mild, in early remission (HCC)  F15.11       Past Medical History: Adjustment disorder, induced psychotic disorder, ADHD, depression, anxiety, intentional drug overdose, substance induced delirium, SI/SA Past Medical History:  Diagnosis Date  . Depression     Past Surgical History:  Procedure Laterality Date  . NO PAST SURGERIES      Family Psychiatric History: Mother depression   Family History: History reviewed. No pertinent family history.  Social History:  Social History   Socioeconomic History  . Marital status: Single    Spouse name: Not on file  . Number of children: Not on file  . Years of education: Not on file  . Highest education level: Not on file  Occupational History  . Not on file  Tobacco Use  . Smoking status: Current Every Day Smoker  . Smokeless tobacco: Current User  Substance and Sexual Activity  . Alcohol use: Yes    Comment: [employer] hard  lemonade on weekends  . Drug use: No  . Sexual activity: Not on file  Other Topics Concern  . Not on file  Social History Narrative  . Not on file   Social Determinants of Health   Financial Resource Strain: Not on file  Food Insecurity: Not on file  Transportation Needs: Not on file  Physical Activity: Not on file  Stress: Not on file  Social Connections: Not on file    Allergies: No Known Allergies  Metabolic Disorder Labs: No results found for: HGBA1C, MPG No results found for: PROLACTIN No results  found for: CHOL, TRIG, HDL, CHOLHDL, VLDL, LDLCALC Lab Results  Component Value Date   TSH 1.377 02/05/2015    Therapeutic Level Labs: No results found for: LITHIUM No results found for: VALPROATE No components found for:  CBMZ  Current Medications: No current outpatient medications on file.   No current facility-administered medications for this visit.     Musculoskeletal: Strength & Muscle Tone: Unable to assess due to telephone visit Gait & Station: Unable to assess due to telephone visit Patient leans: N/A  Psychiatric Specialty Exam: Review of Systems  There were no vitals taken for this visit.There is no height or weight on file to calculate BMI.  General Appearance: Unable to assess due to telephone visit  Eye Contact:  Unable to assess due to telephone visit  Speech:  Clear and Coherent and Normal Rate  Volume:  Normal  Mood:  Euthymic  Affect:  Appropriate and Congruent  Thought Process:  Coherent, Goal Directed and Linear  Orientation:  Full (Time, Place, and Person)  Thought Content: WDL and Logical   Suicidal Thoughts:  No  Homicidal Thoughts:  No  Memory:  Immediate;   Good Recent;   Good Remote;   Good  Judgement:  Good  Insight:  Good  Psychomotor Activity:  Normal  Concentration:  Concentration: Good and Attention Span: Good  Recall:  Good  Fund of Knowledge: Good  Language: Good  Akathisia:  No  Handed:  Right  AIMS (if indicated):Not done  Assets:  Communication Skills Desire for Improvement Financial Resources/Insurance Housing Social Support  ADL's:  Intact  Cognition: WNL  Sleep:  Good   Screenings: AIMS   Flowsheet Row Admission (Discharged) from 02/05/2015 in [facility]INE  AIMS Total Score 0    AUDIT   Flowsheet Row Admission (Discharged) from 02/05/2015 in [facility] INPATIENT BEH[facility]AL MEDICINE  Alcohol Use Disorder Identification Test Final Score (AUDIT) 2    GAD-7   Flowsheet Row Video Visit from 05/23/2020  in [facility] Video Visit from 01/30/2020 in [facility]  Total GAD-7 Score 0 18    PHQ2-9   Flowsheet Row Video Visit from 05/23/2020 in [facility] Video Visit from 01/30/2020 in [facility]  PHQ-2 Total Score 0 5  PHQ-9 Total Score 0 26    Flowsheet Row Video Visit from 05/23/2020 in [facility]  C-SSRS RISK CATEGORY No Risk       Assessment and Plan:  Patient notes that he would like to discontinue Strattera.  At this time he notes that he would not like to be on psychiatric medications.  He will follow-up with provider in 3 months for further evaluation.     Follow up in 3 months  Shanna Cisco, NP 05/23/2020, 3:05 PM

## 2020-05-24 LAB — CBC WITH DIFFERENTIAL/PLATELET
Basophils Absolute: 0.1 10*3/uL (ref 0.0–0.2)
Basos: 2 %
EOS (ABSOLUTE): 0.3 10*3/uL (ref 0.0–0.4)
Eos: 5 %
Hematocrit: 40.6 % (ref 37.5–51.0)
Hemoglobin: 14.2 g/dL (ref 13.0–17.7)
Immature Grans (Abs): 0 10*3/uL (ref 0.0–0.1)
Immature Granulocytes: 0 %
Lymphocytes Absolute: 2.3 10*3/uL (ref 0.7–3.1)
Lymphs: 32 %
MCH: 29.3 pg (ref 26.6–33.0)
MCHC: 35 g/dL (ref 31.5–35.7)
MCV: 84 fL (ref 79–97)
Monocytes Absolute: 0.6 10*3/uL (ref 0.1–0.9)
Monocytes: 9 %
Neutrophils Absolute: 3.8 10*3/uL (ref 1.4–7.0)
Neutrophils: 52 %
Platelets: 425 10*3/uL (ref 150–450)
RBC: 4.84 x10E6/uL (ref 4.14–5.80)
RDW: 13.2 % (ref 11.6–15.4)
WBC: 7.1 10*3/uL (ref 3.4–10.8)

## 2020-05-24 LAB — COMPREHENSIVE METABOLIC PANEL
ALT: 17 IU/L (ref 0–44)
AST: 21 IU/L (ref 0–40)
Albumin/Globulin Ratio: 1.8 (ref 1.2–2.2)
Albumin: 4.8 g/dL (ref 4.1–5.2)
Alkaline Phosphatase: 82 IU/L (ref 44–121)
BUN/Creatinine Ratio: 17 (ref 9–20)
BUN: 14 mg/dL (ref 6–20)
Bilirubin Total: <0.2 mg/dL (ref 0.0–1.2)
CO2: 23 mmol/L (ref 20–29)
Calcium: 9.9 mg/dL (ref 8.7–10.2)
Chloride: 103 mmol/L (ref 96–106)
Creatinine, Ser: 0.84 mg/dL (ref 0.76–1.27)
Globulin, Total: 2.6 g/dL (ref 1.5–4.5)
Glucose: 76 mg/dL (ref 65–99)
Potassium: 5.5 mmol/L — ABNORMAL HIGH (ref 3.5–5.2)
Sodium: 141 mmol/L (ref 134–144)
Total Protein: 7.4 g/dL (ref 6.0–8.5)
eGFR: 121 mL/min/{1.73_m2} (ref >59)

## 2020-05-24 LAB — URINALYSIS
Bilirubin, UA: NEGATIVE
Glucose, UA: NEGATIVE
Ketones, UA: NEGATIVE
Leukocytes,UA: NEGATIVE
Nitrite, UA: NEGATIVE
Protein,UA: NEGATIVE
RBC, UA: NEGATIVE
Specific Gravity, UA: 1.021 (ref 1.005–1.030)
Urobilinogen, Ur: 0.2 mg/dL (ref 0.2–1.0)
pH, UA: 6 (ref 5.0–7.5)

## 2020-05-24 LAB — LIPID PANEL
Chol/HDL Ratio: 3.5 ratio (ref 0.0–5.0)
Cholesterol, Total: 180 mg/dL (ref 100–199)
HDL: 52 mg/dL (ref >39)
LDL Chol Calc (NIH): 105 mg/dL — ABNORMAL HIGH (ref 0–99)
Triglycerides: 132 mg/dL (ref 0–149)
VLDL Cholesterol Cal: 23 mg/dL (ref 5–40)

## 2020-05-30 ENCOUNTER — Ambulatory Visit: Payer: Self-pay | Admitting: Gerontology

## 2020-06-20 ENCOUNTER — Other Ambulatory Visit: Payer: Self-pay

## 2020-06-21 ENCOUNTER — Emergency Department
Admission: EM | Admit: 2020-06-21 | Discharge: 2020-06-22 | Disposition: A | Discharge status: Home / Self Care | Payer: Self-pay | Attending: Emergency Medicine | Admitting: Emergency Medicine

## 2020-06-21 ENCOUNTER — Encounter: Payer: Self-pay | Admitting: Emergency Medicine

## 2020-06-21 ENCOUNTER — Other Ambulatory Visit: Payer: Self-pay

## 2020-06-21 DIAGNOSIS — F17290 Nicotine dependence, other tobacco product, uncomplicated: Secondary | ICD-10-CM | POA: Insufficient documentation

## 2020-06-21 DIAGNOSIS — L03011 Cellulitis of right finger: Secondary | ICD-10-CM | POA: Insufficient documentation

## 2020-06-21 MED ORDER — SULFAMETHOXAZOLE-TRIMETHOPRIM 800-160 MG PO TABS
2.0000 | ORAL_TABLET | Freq: Once | ORAL | Status: AC
  Administered 2020-06-22: 2 via ORAL
  Filled 2020-06-21: qty 2

## 2020-06-21 MED ORDER — IBUPROFEN 800 MG PO TABS
800.0000 mg | ORAL_TABLET | Freq: Once | ORAL | Status: AC
  Administered 2020-06-22: 800 mg via ORAL
  Filled 2020-06-21: qty 1

## 2020-06-21 MED ORDER — SULFAMETHOXAZOLE-TRIMETHOPRIM 800-160 MG PO TABS: 2.0000 | ORAL_TABLET | Freq: Two times a day (BID) | ORAL | 0 refills | Status: AC

## 2020-06-21 NOTE — ED Provider Notes (Signed)
[facility] Emergency Department Provider Note   ____________________________________________   Event Date/Time   First MD Initiated Contact with Patient 06/21/20 2341     (approximate)  I have reviewed the triage vital signs and the nursing notes.   HISTORY  Chief Complaint Paronychia    HPI Zaydrian Batta is a 30 y.o. male who presents to the ED from [facility] with a chief complaint of right fifth digit finger infection.  Patient has a habit of biting his fingernails and cuticles and endorses swelling to his right fifth digit x10 days.  States swelling is significantly reduced now because area started draining.  Denies fever, chills, nausea or vomiting.     Past Medical History:  Diagnosis Date  . Depression     Patient Active Problem List   Diagnosis Date Noted  . Recurrent major depressive disorder, in partial remission (HCC) 05/23/2020  . Methamphetamine use disorder, mild, in early remission (HCC) 05/23/2020  . Encounter to establish care 04/25/2020  . Blurry vision, bilateral 04/25/2020  . Broken or cracked tooth, nontraumatic 04/25/2020  . Attention deficit hyperactivity disorder (ADHD), predominantly inattentive type 01/30/2020  . Severe episode of recurrent major depressive disorder, without psychotic features (HCC) 01/30/2020  . Generalized anxiety disorder 01/30/2020  . Intentional drug overdose (HCC) 02/04/2015  . Tobacco use disorder 02/04/2015  . Abdominal pain 02/04/2015  . Adjustment disorder with depressed mood     Past Surgical History:  Procedure Laterality Date  . NO PAST SURGERIES      Prior to Admission medications   Medication Sig Start Date End Date Taking? Authorizing Provider  sulfamethoxazole-trimethoprim (BACTRIM DS) 800-160 MG tablet Take 2 tablets by mouth 2 (two) times daily. 06/21/20  Yes Irean Hong, MD    Allergies Patient has no known allergies.  No family history on file.  Social History Social History    Tobacco Use  . Smoking status: Current Every Day Smoker    Types: E-cigarettes  . Smokeless tobacco: Current User  Vaping Use  . Vaping Use: Every day  Substance Use Topics  . Alcohol use: Not Currently  . Drug use: Not Currently    Types: Methamphetamines    Comment: Clean x 6 months    Review of Systems  Constitutional: No fever/chills Eyes: No visual changes. ENT: No sore throat. Cardiovascular: Denies chest pain. Respiratory: Denies shortness of breath. Gastrointestinal: No abdominal pain.  No nausea, no vomiting.  No diarrhea.  No constipation. Genitourinary: Negative for dysuria. Musculoskeletal: Positive for right fifth digit fingertip infection.  Negative for back pain. Skin: Negative for rash. Neurological: Negative for headaches, focal weakness or numbness.   ____________________________________________   PHYSICAL EXAM:  VITAL SIGNS: ED Triage Vitals  Enc Vitals Group     BP 06/21/20 2139 (!) 111/93     Pulse Rate 06/21/20 2139 75     Resp 06/21/20 2139 15     Temp 06/21/20 2139 98.5 F (36.9 C)     Temp Source 06/21/20 2139 Oral     SpO2 06/21/20 2139 98 %     Weight 06/21/20 2140 165 lb (74.8 kg)     Height 06/21/20 2140 5\' 9"  (1.753 m)     Head Circumference --      Peak Flow --      Pain Score 06/21/20 2140 4     Pain Loc --      Pain Edu? --      Excl. in GC? --  Constitutional: Alert and oriented. Well appearing and in no acute distress. Eyes: Conjunctivae are normal. PERRL. EOMI. Head: Atraumatic. Nose: No congestion/rhinnorhea. Mouth/Throat: Mucous membranes are moist.   Neck: No stridor.   Cardiovascular: Normal rate, regular rhythm. Grossly normal heart sounds.  Good peripheral circulation. Respiratory: Normal respiratory effort.  No retractions. Lungs CTAB. Gastrointestinal: Soft and nontender. No distention. No abdominal bruits. No CVA tenderness. Musculoskeletal:  Right fifth digit with minimal redness and swelling  surrounding cuticle with central scab from drainage.  No fluctuance.  Of note, right third digit also with minimal redness and swelling surrounding cuticle. Neurologic:  Normal speech and language. No gross focal neurologic deficits are appreciated. No gait instability. Skin:  Skin is warm, dry and intact. No rash noted. Psychiatric: Mood and affect are normal. Speech and behavior are normal.  ____________________________________________   LABS (all labs ordered are listed, but only abnormal results are displayed)  Labs Reviewed - No data to display ____________________________________________  EKG  None ____________________________________________  RADIOLOGY I, Maricela Kawahara J, personally viewed and evaluated these images (plain radiographs) as part of my medical decision making, as well as reviewing the written report by the radiologist.  ED MD interpretation: None  Official radiology report(s): No results found.  ____________________________________________   PROCEDURES  Procedure(s) performed (including Critical Care):  Procedures   ____________________________________________   INITIAL IMPRESSION / ASSESSMENT AND PLAN / ED COURSE  As part of my medical decision making, I reviewed the following data within the electronic MEDICAL RECORD NUMBER Nursing notes reviewed and incorporated and Notes from prior ED visits     30 year old male presenting with draining paronychia.  Will place on Bactrim and patient will follow up with orthopedics as needed.  Advised Epson salt soaks and to stop biting his nails and cuticles.  Strict return precautions given.  Patient verbalizes understanding agrees with plan of care.      ____________________________________________   FINAL CLINICAL IMPRESSION(S) / ED DIAGNOSES  Final diagnoses:  Paronychia of finger of right hand     ED Discharge Orders         Ordered    sulfamethoxazole-trimethoprim (BACTRIM DS) 800-160 MG tablet  2  times daily        06/21/20 2353          *Please note:  Elvan Ebron was evaluated in Emergency Department on 06/21/2020 for the symptoms described in the history of present illness. He was evaluated in the context of the global COVID-19 pandemic, which necessitated consideration that the patient might be at risk for infection with the SARS-CoV-2 virus that causes COVID-19. Institutional protocols and algorithms that pertain to the evaluation of patients at risk for COVID-19 are in a state of rapid change based on information released by regulatory bodies including the CDC and federal and state organizations. These policies and algorithms were followed during the patient's care in the ED.  Some ED evaluations and interventions may be delayed as a result of limited staffing during and the pandemic.*   Note:  This document was prepared using Dragon voice recognition software and may include unintentional dictation errors.   Irean Hong, MD 06/22/20 [phone]

## 2020-06-21 NOTE — Discharge Instructions (Addendum)
You may take Tylenol and/or ibuprofen as needed for pain.  Soak finger in warm water with Epsom salt 3 times daily.  Take antibiotic as prescribed until finished (Bactrim DS 2 tablets twice daily x10 days).  Return to the ER for worsening symptoms, persistent vomiting, fever or other concerns.

## 2020-06-21 NOTE — ED Triage Notes (Signed)
Pt in via POV, right fifth digit red, blistered around cuticle.  States symptoms ongoing x approximately 10 days.  Vitals WDL, NAD noted at this time.

## 2020-08-16 ENCOUNTER — Telehealth (HOSPITAL_COMMUNITY): Payer: No Payment, Other | Admitting: Psychiatry
